# Patient Record
Sex: Male | Born: 1980 | Race: White | Hispanic: No | Marital: Single | Smoking: Current every day smoker
Health system: Southern US, Community
[De-identification: ages and names within clinical notes are randomized; demographics above are authoritative.]

## PROBLEM LIST (undated history)

## (undated) DIAGNOSIS — T7840XA Allergy, unspecified, initial encounter: Secondary | ICD-10-CM

## (undated) DIAGNOSIS — S42302A Unspecified fracture of shaft of humerus, left arm, initial encounter for closed fracture: Secondary | ICD-10-CM

## (undated) HISTORY — DX: Unspecified fracture of shaft of humerus, left arm, initial encounter for closed fracture: S42.302A

## (undated) HISTORY — DX: Allergy, unspecified, initial encounter: T78.40XA

## (undated) HISTORY — PX: FOREARM SURGERY: SHX651

---

## 1999-06-27 ENCOUNTER — Emergency Department (HOSPITAL_COMMUNITY): Admission: EM | Admit: 1999-06-27 | Discharge: 1999-06-27 | Payer: Self-pay | Admitting: Emergency Medicine

## 1999-07-01 ENCOUNTER — Emergency Department (HOSPITAL_COMMUNITY): Admission: EM | Admit: 1999-07-01 | Discharge: 1999-07-01 | Payer: Self-pay | Admitting: Emergency Medicine

## 1999-07-27 ENCOUNTER — Emergency Department (HOSPITAL_COMMUNITY): Admission: EM | Admit: 1999-07-27 | Discharge: 1999-07-27 | Payer: Self-pay | Admitting: Emergency Medicine

## 2011-05-17 ENCOUNTER — Ambulatory Visit: Payer: Self-pay | Admitting: Physician Assistant

## 2011-05-17 VITALS — BP 121/90 | HR 81 | Temp 99.6°F | Resp 18 | Ht 69.0 in | Wt 194.6 lb

## 2011-05-17 DIAGNOSIS — R112 Nausea with vomiting, unspecified: Secondary | ICD-10-CM

## 2011-05-17 DIAGNOSIS — R1013 Epigastric pain: Secondary | ICD-10-CM

## 2011-05-17 DIAGNOSIS — R5383 Other fatigue: Secondary | ICD-10-CM

## 2011-05-17 DIAGNOSIS — R6881 Early satiety: Secondary | ICD-10-CM

## 2011-05-17 LAB — POCT CBC
HCT, POC: 46.8 % (ref 43.5–53.7)
Hemoglobin: 15.6 g/dL (ref 14.1–18.1)
Lymph, poc: 1.1 (ref 0.6–3.4)
MCH, POC: 31.9 pg — AB (ref 27–31.2)
MCHC: 33.3 g/dL (ref 31.8–35.4)
MCV: 95.7 fL (ref 80–97)
POC Granulocyte: 4.4 (ref 2–6.9)
POC LYMPH PERCENT: 17.4 %L (ref 10–50)
RDW, POC: 13.2 %
WBC: 6.4 10*3/uL (ref 4.6–10.2)

## 2011-05-17 MED ORDER — RANITIDINE HCL 150 MG PO TABS: 150.0000 mg | ORAL_TABLET | Freq: Two times a day (BID) | ORAL | Status: AC

## 2011-05-17 NOTE — Patient Instructions (Addendum)
Please reduce your tobacco use and alcohol consumption.  Reduce food and drink that are high in fat, spice and acid.  If your symptoms are not improving, please return for additional evaluation and possible referral.

## 2011-05-17 NOTE — Progress Notes (Signed)
  Subjective:    Patient ID: John Ritter, male    DOB: 1980/06/18, 31 y.o.   MRN: [id number]  HPI Patient presents with abdominal pain, nausea and vomiting x 6 weeks. Early satiety.  Symptoms worse with eating, if doesn't take TUMS/Pepto Bismol will vomit after eating.  Significant loss of appetite.  Trying to eat very small portions. No weight loss noticed.  No hematochezia, stools dark since starting PeptoBismol.  Fatigue.  No subjective fever/chills.  Has had diarrhea. Symptoms wax and wane, but not resolved since onset.  Drinks regularly.  Smokes 1-1.5 ppd.  No NSAIDS.  TUMS QD-BID x 6 years.  Review of Systems As above.    Objective:   Physical Exam  Vital signs noted. Well-developed, well nourished WM who is awake, alert and oriented, in NAD. HEENT: [place]/AT, sclera and conjunctiva are clear.  Neck: supple, non-tender, no lymphadenopathey, thyromegaly. Heart: RRR, no murmur Lungs: CTA Abdomen: normo-active bowel sounds, supple, no mass or organomegaly. Epigastric tenderness. Extremities: no cyanosis, clubbing or edema. Skin: warm and dry without rash.  Results for orders placed in visit on 05/17/11  POCT CBC      Component Value Range   WBC 6.4  4.6 - 10.2 (K/uL)   Lymph, poc 1.1  0.6 - 3.4    POC LYMPH PERCENT 17.4  10 - 50 (%L)   MID (cbc) 0.9  0 - 0.9    POC MID % 13.9 (*) 0 - 12 (%M)   POC Granulocyte 4.4  2 - 6.9    Granulocyte percent 68.7  37 - 80 (%G)   RBC 4.89  4.69 - 6.13 (M/uL)   Hemoglobin 15.6  14.1 - 18.1 (g/dL)   HCT, POC 40.9  81.1 - 53.7 (%)   MCV 95.7  80 - 97 (fL)   MCH, POC 31.9 (*) 27 - 31.2 (pg)   MCHC 33.3  31.8 - 35.4 (g/dL)   RDW, POC 91.4     Platelet Count, POC 284  142 - 424 (K/uL)   MPV 7.8  0 - 99.8 (fL)       Assessment & Plan:   1. Fatigue  POCT CBC  2. Nausea & vomiting    3. Abdominal pain, epigastric    4. Early satiety     Likely GERD.  Add ranitidine 150 mg BID.  If no better in 1 week, add OTC omeprazole.  If still no  better, RTC.  Will proceed with additional evaluation that was deferred today due to cost.  Avoid spice, acid, fat, reduce EtOH and tobacco.

## 2015-05-16 ENCOUNTER — Emergency Department (HOSPITAL_COMMUNITY): Payer: Self-pay | Admitting: Anesthesiology

## 2015-05-16 ENCOUNTER — Inpatient Hospital Stay (HOSPITAL_COMMUNITY)
Admission: EM | Admit: 2015-05-16 | Discharge: 2015-05-20 | DRG: 481 | Disposition: A | Discharge status: Home / Self Care | Payer: Self-pay | Attending: Orthopedic Surgery | Admitting: Orthopedic Surgery

## 2015-05-16 ENCOUNTER — Encounter (HOSPITAL_COMMUNITY): Payer: Self-pay | Admitting: Emergency Medicine

## 2015-05-16 ENCOUNTER — Encounter (HOSPITAL_COMMUNITY)
Admission: EM | Disposition: A | Discharge status: Home / Self Care | Payer: Self-pay | Source: Home / Self Care | Attending: Orthopedic Surgery

## 2015-05-16 ENCOUNTER — Emergency Department (HOSPITAL_COMMUNITY): Payer: Self-pay

## 2015-05-16 DIAGNOSIS — W109XXA Fall (on) (from) unspecified stairs and steps, initial encounter: Secondary | ICD-10-CM | POA: Diagnosis present

## 2015-05-16 DIAGNOSIS — Y92099 Unspecified place in other non-institutional residence as the place of occurrence of the external cause: Secondary | ICD-10-CM

## 2015-05-16 DIAGNOSIS — Z9889 Other specified postprocedural states: Secondary | ICD-10-CM

## 2015-05-16 DIAGNOSIS — S72142A Displaced intertrochanteric fracture of left femur, initial encounter for closed fracture: Principal | ICD-10-CM | POA: Diagnosis present

## 2015-05-16 DIAGNOSIS — S42202A Unspecified fracture of upper end of left humerus, initial encounter for closed fracture: Secondary | ICD-10-CM | POA: Diagnosis present

## 2015-05-16 DIAGNOSIS — F10129 Alcohol abuse with intoxication, unspecified: Secondary | ICD-10-CM | POA: Diagnosis present

## 2015-05-16 DIAGNOSIS — F17210 Nicotine dependence, cigarettes, uncomplicated: Secondary | ICD-10-CM | POA: Diagnosis present

## 2015-05-16 DIAGNOSIS — Z8781 Personal history of (healed) traumatic fracture: Secondary | ICD-10-CM

## 2015-05-16 DIAGNOSIS — S42302A Unspecified fracture of shaft of humerus, left arm, initial encounter for closed fracture: Secondary | ICD-10-CM

## 2015-05-16 DIAGNOSIS — S72009A Fracture of unspecified part of neck of unspecified femur, initial encounter for closed fracture: Secondary | ICD-10-CM | POA: Diagnosis present

## 2015-05-16 DIAGNOSIS — S72002A Fracture of unspecified part of neck of left femur, initial encounter for closed fracture: Secondary | ICD-10-CM

## 2015-05-16 DIAGNOSIS — K5900 Constipation, unspecified: Secondary | ICD-10-CM | POA: Diagnosis not present

## 2015-05-16 HISTORY — PX: INTRAMEDULLARY (IM) NAIL INTERTROCHANTERIC: SHX5875

## 2015-05-16 LAB — CBC WITH DIFFERENTIAL/PLATELET
BASOS ABS: 0 10*3/uL (ref 0.0–0.1)
Basophils Relative: 0 %
EOS ABS: 0 10*3/uL (ref 0.0–0.7)
EOS PCT: 0 %
HCT: 40.7 % (ref 39.0–52.0)
HEMOGLOBIN: 13.9 g/dL (ref 13.0–17.0)
LYMPHS ABS: 1.8 10*3/uL (ref 0.7–4.0)
Lymphocytes Relative: 19 %
MCH: 32.3 pg (ref 26.0–34.0)
MCHC: 34.2 g/dL (ref 30.0–36.0)
MCV: 94.7 fL (ref 78.0–100.0)
Monocytes Absolute: 0.7 10*3/uL (ref 0.1–1.0)
Monocytes Relative: 7 %
NEUTROS PCT: 74 %
Neutro Abs: 6.9 10*3/uL (ref 1.7–7.7)
PLATELETS: 290 10*3/uL (ref 150–400)
RBC: 4.3 MIL/uL (ref 4.22–5.81)
RDW: 13.2 % (ref 11.5–15.5)
WBC: 9.4 10*3/uL (ref 4.0–10.5)

## 2015-05-16 LAB — RAPID URINE DRUG SCREEN, HOSP PERFORMED
Amphetamines: NOT DETECTED
BARBITURATES: NOT DETECTED
Benzodiazepines: NOT DETECTED
COCAINE: NOT DETECTED
Opiates: NOT DETECTED
TETRAHYDROCANNABINOL: NOT DETECTED

## 2015-05-16 LAB — COMPREHENSIVE METABOLIC PANEL
ALBUMIN: 3.5 g/dL (ref 3.5–5.0)
ALK PHOS: 62 U/L (ref 38–126)
ALT: 103 U/L — AB (ref 17–63)
AST: 82 U/L — AB (ref 15–41)
Anion gap: 15 (ref 5–15)
BUN: 7 mg/dL (ref 6–20)
CALCIUM: 8.4 mg/dL — AB (ref 8.9–10.3)
CHLORIDE: 105 mmol/L (ref 101–111)
CO2: 21 mmol/L — AB (ref 22–32)
CREATININE: 0.74 mg/dL (ref 0.61–1.24)
GFR calc non Af Amer: >60 mL/min (ref >60)
GLUCOSE: 92 mg/dL (ref 65–99)
Potassium: 4.3 mmol/L (ref 3.5–5.1)
SODIUM: 141 mmol/L (ref 135–145)
Total Bilirubin: 0.5 mg/dL (ref 0.3–1.2)
Total Protein: 6.8 g/dL (ref 6.5–8.1)

## 2015-05-16 SURGERY — FIXATION, FRACTURE, INTERTROCHANTERIC, WITH INTRAMEDULLARY ROD
Anesthesia: General | Laterality: Left

## 2015-05-16 MED ORDER — ACETAMINOPHEN 650 MG RE SUPP: 650.0000 mg | Freq: Four times a day (QID) | RECTAL | Status: DC | PRN

## 2015-05-16 MED ORDER — LORAZEPAM 1 MG PO TABS: 1.0000 mg | ORAL_TABLET | Freq: Four times a day (QID) | ORAL | Status: DC | PRN

## 2015-05-16 MED ORDER — NEOSTIGMINE METHYLSULFATE 10 MG/10ML IV SOLN
INTRAVENOUS | Status: DC | PRN
  Administered 2015-05-16: 3 mg via INTRAVENOUS

## 2015-05-16 MED ORDER — LACTATED RINGERS IV SOLN: INTRAVENOUS | Status: DC

## 2015-05-16 MED ORDER — FOLIC ACID 1 MG PO TABS: 1.0000 mg | ORAL_TABLET | Freq: Every day | ORAL | Status: DC

## 2015-05-16 MED ORDER — HYDROMORPHONE HCL 1 MG/ML IJ SOLN
INTRAMUSCULAR | Status: AC
  Filled 2015-05-16: qty 1

## 2015-05-16 MED ORDER — ACETAMINOPHEN 10 MG/ML IV SOLN
INTRAVENOUS | Status: AC
  Filled 2015-05-16: qty 100

## 2015-05-16 MED ORDER — ONDANSETRON HCL 4 MG PO TABS: 4.0000 mg | ORAL_TABLET | Freq: Four times a day (QID) | ORAL | Status: DC | PRN

## 2015-05-16 MED ORDER — METOCLOPRAMIDE HCL 5 MG/ML IJ SOLN: 5.0000 mg | Freq: Three times a day (TID) | INTRAMUSCULAR | Status: DC | PRN

## 2015-05-16 MED ORDER — CEFAZOLIN (ANCEF) 1 G IV SOLR
2.0000 g | INTRAVENOUS | Status: DC
Start: 2015-05-16 — End: 2015-05-16

## 2015-05-16 MED ORDER — METHOCARBAMOL 500 MG PO TABS
500.0000 mg | ORAL_TABLET | Freq: Four times a day (QID) | ORAL | Status: DC | PRN
  Administered 2015-05-17 – 2015-05-20 (×11): 500 mg via ORAL
  Filled 2015-05-16 (×13): qty 1

## 2015-05-16 MED ORDER — ONDANSETRON HCL 4 MG/2ML IJ SOLN
INTRAMUSCULAR | Status: DC | PRN
  Administered 2015-05-16: 4 mg via INTRAVENOUS

## 2015-05-16 MED ORDER — PROPOFOL 10 MG/ML IV BOLUS
INTRAVENOUS | Status: DC | PRN
  Administered 2015-05-16: 130 mg via INTRAVENOUS

## 2015-05-16 MED ORDER — MORPHINE SULFATE (PF) 2 MG/ML IV SOLN
0.5000 mg | INTRAVENOUS | Status: DC | PRN
  Administered 2015-05-16: 0.5 mg via INTRAVENOUS
  Filled 2015-05-16: qty 1

## 2015-05-16 MED ORDER — LACTATED RINGERS IV SOLN
INTRAVENOUS | Status: DC | PRN
  Administered 2015-05-16 (×2): via INTRAVENOUS

## 2015-05-16 MED ORDER — LORAZEPAM 2 MG/ML IJ SOLN
0.5000 mg | Freq: Once | INTRAMUSCULAR | Status: AC
  Administered 2015-05-16: 0.5 mg via INTRAVENOUS
  Filled 2015-05-16: qty 1

## 2015-05-16 MED ORDER — MORPHINE SULFATE (PF) 2 MG/ML IV SOLN
2.0000 mg | INTRAVENOUS | Status: DC | PRN
  Administered 2015-05-16 – 2015-05-17 (×3): 2 mg via INTRAVENOUS
  Filled 2015-05-16 (×3): qty 1

## 2015-05-16 MED ORDER — PROPOFOL 10 MG/ML IV BOLUS
INTRAVENOUS | Status: AC
  Filled 2015-05-16: qty 20

## 2015-05-16 MED ORDER — ADULT MULTIVITAMIN W/MINERALS CH: 1.0000 | ORAL_TABLET | Freq: Every day | ORAL | Status: DC

## 2015-05-16 MED ORDER — 0.9 % SODIUM CHLORIDE (POUR BTL) OPTIME
TOPICAL | Status: DC | PRN
  Administered 2015-05-16: 300 mL

## 2015-05-16 MED ORDER — SODIUM CHLORIDE 0.9 % IV BOLUS (SEPSIS)
500.0000 mL | Freq: Once | INTRAVENOUS | Status: AC
  Administered 2015-05-16: 500 mL via INTRAVENOUS

## 2015-05-16 MED ORDER — LORAZEPAM 2 MG/ML IJ SOLN: 1.0000 mg | Freq: Four times a day (QID) | INTRAMUSCULAR | Status: DC | PRN

## 2015-05-16 MED ORDER — MIDAZOLAM HCL 5 MG/5ML IJ SOLN
INTRAMUSCULAR | Status: DC | PRN
  Administered 2015-05-16: 2 mg via INTRAVENOUS

## 2015-05-16 MED ORDER — ACETAMINOPHEN 10 MG/ML IV SOLN
INTRAVENOUS | Status: DC | PRN
Start: 2015-05-16 — End: 2015-05-16
  Administered 2015-05-16: 1000 mg via INTRAVENOUS

## 2015-05-16 MED ORDER — METOCLOPRAMIDE HCL 5 MG PO TABS: 5.0000 mg | ORAL_TABLET | Freq: Three times a day (TID) | ORAL | Status: DC | PRN

## 2015-05-16 MED ORDER — ASPIRIN EC 325 MG PO TBEC
325.0000 mg | DELAYED_RELEASE_TABLET | Freq: Every day | ORAL | Status: DC
  Administered 2015-05-17 – 2015-05-20 (×4): 325 mg via ORAL
  Filled 2015-05-16 (×4): qty 1

## 2015-05-16 MED ORDER — PHENOL 1.4 % MT LIQD: 1.0000 | OROMUCOSAL | Status: DC | PRN

## 2015-05-16 MED ORDER — FENTANYL CITRATE (PF) 250 MCG/5ML IJ SOLN
INTRAMUSCULAR | Status: AC
  Filled 2015-05-16: qty 5

## 2015-05-16 MED ORDER — BUPIVACAINE HCL (PF) 0.25 % IJ SOLN
INTRAMUSCULAR | Status: AC
  Filled 2015-05-16: qty 30

## 2015-05-16 MED ORDER — FENTANYL CITRATE (PF) 250 MCG/5ML IJ SOLN
INTRAMUSCULAR | Status: DC | PRN
  Administered 2015-05-16: 50 ug via INTRAVENOUS
  Administered 2015-05-16 (×2): 100 ug via INTRAVENOUS

## 2015-05-16 MED ORDER — GLYCOPYRROLATE 0.2 MG/ML IJ SOLN
INTRAMUSCULAR | Status: DC | PRN
  Administered 2015-05-16: 0.4 mg via INTRAVENOUS

## 2015-05-16 MED ORDER — BUPIVACAINE HCL (PF) 0.25 % IJ SOLN
INTRAMUSCULAR | Status: DC | PRN
  Administered 2015-05-16: 10 mL

## 2015-05-16 MED ORDER — CEFAZOLIN SODIUM-DEXTROSE 2-3 GM-% IV SOLR
2.0000 g | Freq: Four times a day (QID) | INTRAVENOUS | Status: AC
  Administered 2015-05-16 – 2015-05-17 (×2): 2 g via INTRAVENOUS
  Filled 2015-05-16 (×2): qty 50

## 2015-05-16 MED ORDER — CEFAZOLIN SODIUM-DEXTROSE 2-3 GM-% IV SOLR
INTRAVENOUS | Status: DC | PRN
  Administered 2015-05-16: 2 g via INTRAVENOUS

## 2015-05-16 MED ORDER — MIDAZOLAM HCL 2 MG/2ML IJ SOLN
INTRAMUSCULAR | Status: AC
  Filled 2015-05-16: qty 2

## 2015-05-16 MED ORDER — VITAMIN B-1 100 MG PO TABS: 100.0000 mg | ORAL_TABLET | Freq: Every day | ORAL | Status: DC

## 2015-05-16 MED ORDER — LIDOCAINE HCL (CARDIAC) 20 MG/ML IV SOLN
INTRAVENOUS | Status: DC | PRN
  Administered 2015-05-16: 80 mg via INTRATRACHEAL

## 2015-05-16 MED ORDER — SUCCINYLCHOLINE CHLORIDE 20 MG/ML IJ SOLN
INTRAMUSCULAR | Status: DC | PRN
  Administered 2015-05-16: 120 mg via INTRAVENOUS

## 2015-05-16 MED ORDER — OXYCODONE HCL 5 MG PO TABS
5.0000 mg | ORAL_TABLET | ORAL | Status: DC | PRN
  Administered 2015-05-16 – 2015-05-20 (×21): 10 mg via ORAL
  Filled 2015-05-16 (×22): qty 2

## 2015-05-16 MED ORDER — BUPIVACAINE-EPINEPHRINE (PF) 0.25% -1:200000 IJ SOLN
INTRAMUSCULAR | Status: AC
  Filled 2015-05-16: qty 30

## 2015-05-16 MED ORDER — BUPIVACAINE-EPINEPHRINE 0.25% -1:200000 IJ SOLN
INTRAMUSCULAR | Status: DC | PRN
  Administered 2015-05-16: 5 mL

## 2015-05-16 MED ORDER — LIDOCAINE HCL (CARDIAC) 20 MG/ML IV SOLN
INTRAVENOUS | Status: AC
  Filled 2015-05-16: qty 10

## 2015-05-16 MED ORDER — THIAMINE HCL 100 MG/ML IJ SOLN: 100.0000 mg | Freq: Every day | INTRAMUSCULAR | Status: DC

## 2015-05-16 MED ORDER — HYDROMORPHONE HCL 1 MG/ML IJ SOLN
INTRAMUSCULAR | Status: AC
Start: 2015-05-16 — End: 2015-05-17
  Filled 2015-05-16: qty 1

## 2015-05-16 MED ORDER — MENTHOL 3 MG MT LOZG: 1.0000 | LOZENGE | OROMUCOSAL | Status: DC | PRN

## 2015-05-16 MED ORDER — ONDANSETRON HCL 4 MG/2ML IJ SOLN: 4.0000 mg | Freq: Four times a day (QID) | INTRAMUSCULAR | Status: DC | PRN

## 2015-05-16 MED ORDER — METHOCARBAMOL 1000 MG/10ML IJ SOLN
500.0000 mg | Freq: Four times a day (QID) | INTRAVENOUS | Status: DC | PRN
  Administered 2015-05-16: 500 mg via INTRAVENOUS
  Filled 2015-05-16 (×2): qty 5

## 2015-05-16 MED ORDER — MORPHINE SULFATE (PF) 2 MG/ML IV SOLN: 2.0000 mg | Freq: Once | INTRAVENOUS | Status: DC

## 2015-05-16 MED ORDER — CEFAZOLIN SODIUM-DEXTROSE 2-3 GM-% IV SOLR
INTRAVENOUS | Status: AC
  Filled 2015-05-16: qty 50

## 2015-05-16 MED ORDER — ROCURONIUM BROMIDE 100 MG/10ML IV SOLN
INTRAVENOUS | Status: DC | PRN
  Administered 2015-05-16: 20 mg via INTRAVENOUS
  Administered 2015-05-16: 10 mg via INTRAVENOUS

## 2015-05-16 MED ORDER — ACETAMINOPHEN 325 MG PO TABS
650.0000 mg | ORAL_TABLET | Freq: Four times a day (QID) | ORAL | Status: DC | PRN
  Administered 2015-05-17: 650 mg via ORAL
  Filled 2015-05-16: qty 2

## 2015-05-16 MED ORDER — DIPHENHYDRAMINE HCL 50 MG/ML IJ SOLN
INTRAMUSCULAR | Status: DC | PRN
  Administered 2015-05-16: 25 mg via INTRAVENOUS

## 2015-05-16 MED ORDER — HYDROMORPHONE HCL 1 MG/ML IJ SOLN
0.2500 mg | INTRAMUSCULAR | Status: DC | PRN
  Administered 2015-05-16 (×4): 0.5 mg via INTRAVENOUS

## 2015-05-16 SURGICAL SUPPLY — 46 items
BIT DRILL FLUTED FEMUR 4.2/3 (BIT) ×2 IMPLANT
BLADE HELICAL TFNA STRL (Anchor) ×2 IMPLANT
BNDG COHESIVE 4X5 TAN STRL (GAUZE/BANDAGES/DRESSINGS) ×3 IMPLANT
BNDG GAUZE ELAST 4 BULKY (GAUZE/BANDAGES/DRESSINGS) ×3 IMPLANT
COVER PERINEAL POST (MISCELLANEOUS) ×3 IMPLANT
COVER SURGICAL LIGHT HANDLE (MISCELLANEOUS) ×3 IMPLANT
DRAPE STERI IOBAN 125X83 (DRAPES) ×3 IMPLANT
DRAPE U-SHAPE 47X51 STRL (DRAPES) ×6 IMPLANT
DRSG AQUACEL AG ADV 3.5X10 (GAUZE/BANDAGES/DRESSINGS) ×4 IMPLANT
DRSG MEPILEX BORDER 4X8 (GAUZE/BANDAGES/DRESSINGS) ×3 IMPLANT
DRSG PAD ABDOMINAL 8X10 ST (GAUZE/BANDAGES/DRESSINGS) ×3 IMPLANT
ELECT PENCIL ROCKER SW 15FT (MISCELLANEOUS) ×3 IMPLANT
ELECT REM PT RETURN 9FT ADLT (ELECTROSURGICAL) ×3
ELECTRODE REM PT RTRN 9FT ADLT (ELECTROSURGICAL) ×1 IMPLANT
GLOVE BIO SURGEON STRL SZ 6.5 (GLOVE) ×2 IMPLANT
GLOVE BIO SURGEONS STRL SZ 6.5 (GLOVE) ×1
GLOVE BIOGEL PI IND STRL 6.5 (GLOVE) ×1 IMPLANT
GLOVE BIOGEL PI IND STRL 8.5 (GLOVE) ×1 IMPLANT
GLOVE BIOGEL PI INDICATOR 6.5 (GLOVE) ×2
GLOVE BIOGEL PI INDICATOR 8.5 (GLOVE) ×2
GLOVE SS BIOGEL STRL SZ 8.5 (GLOVE) ×1 IMPLANT
GLOVE SUPERSENSE BIOGEL SZ 8.5 (GLOVE) ×2
GOWN STRL REUS W/ TWL LRG LVL3 (GOWN DISPOSABLE) ×1 IMPLANT
GOWN STRL REUS W/TWL 2XL LVL3 (GOWN DISPOSABLE) ×6 IMPLANT
GOWN STRL REUS W/TWL LRG LVL3 (GOWN DISPOSABLE) ×3
GUIDEWIRE 3.2X400 (WIRE) ×4 IMPLANT
KIT BASIN OR (CUSTOM PROCEDURE TRAY) ×3 IMPLANT
KIT ROOM TURNOVER OR (KITS) ×3 IMPLANT
LINER BOOT UNIVERSAL DISP (MISCELLANEOUS) ×3 IMPLANT
MANIFOLD NEPTUNE II (INSTRUMENTS) ×3 IMPLANT
NAIL IM CANN TFNA 12X235 130D (Nail) IMPLANT
NAIL TI CANN TFNA 12MM/130DEG (Nail) ×2 IMPLANT
NEEDLE 22X1 1/2 (OR ONLY) (NEEDLE) ×2 IMPLANT
NS IRRIG 1000ML POUR BTL (IV SOLUTION) ×3 IMPLANT
PACK GENERAL/GYN (CUSTOM PROCEDURE TRAY) ×3 IMPLANT
PAD ARMBOARD 7.5X6 YLW CONV (MISCELLANEOUS) ×6 IMPLANT
SCREW LOCK STAR 5X38 (Screw) ×3 IMPLANT
STAPLER VISISTAT 35W (STAPLE) IMPLANT
SURGIFLO W/THROMBIN 8M KIT (HEMOSTASIS) IMPLANT
SUT BONE WAX W31G (SUTURE) ×3 IMPLANT
SUT VIC AB 1 CT1 18XCR BRD 8 (SUTURE) ×1 IMPLANT
SUT VIC AB 1 CT1 8-18 (SUTURE) ×3
SUT VIC AB 2-0 CT1 18 (SUTURE) ×3 IMPLANT
SUT VIC AB 2-0 CTB1 (SUTURE) IMPLANT
SYR CONTROL 10ML LL (SYRINGE) ×2 IMPLANT
WATER STERILE IRR 1000ML POUR (IV SOLUTION) ×6 IMPLANT

## 2015-05-16 NOTE — Brief Op Note (Signed)
05/16/2015  7:33 PM  PATIENT:  John Ritter  35 y.o. male  PRE-OPERATIVE DIAGNOSIS:  hip fracture left  POST-OPERATIVE DIAGNOSIS:  hip fracture left  PROCEDURE:  Procedure(s): INTRAMEDULLARY (IM) NAIL INTERTROCHANTRIC (Left)  SURGEON:  Surgeon(s) and Role:    * Venita Lickahari Dailin Sosnowski, MD - Primary  PHYSICIAN ASSISTANT:   ASSISTANTS: none   ANESTHESIA:   general  EBL:  Total I/O In: 500 [I.V.:500] Out: 250 [Blood:250]  BLOOD ADMINISTERED:none  DRAINS: none   LOCAL MEDICATIONS USED:  MARCAINE     SPECIMEN:  No Specimen  DISPOSITION OF SPECIMEN:  N/A  COUNTS:  YES  TOURNIQUET:  * No tourniquets in log *  DICTATION: .Other Dictation: Dictation Number [id number]  PLAN OF CARE: Admit to inpatient   PATIENT DISPOSITION:  PACU - hemodynamically stable.

## 2015-05-16 NOTE — Progress Notes (Signed)
Orthopedic Tech Progress Note Patient Details:  John SchlichterZachariah D Ritter [birth year] [id number]  Ortho Devices Ortho Device/Splint Location: applied ohf to bed Ortho Device/Splint Interventions: Ordered, Application   Jennye MoccasinHughes, Tammy Ericsson Craig 05/16/2015, 10:30 PM

## 2015-05-16 NOTE — Op Note (Signed)
John Ritter, John Ritter            ACCOUNT NO.:  [id number]  MEDICAL RECORD NO.:  [id number]  LOCATION:  [facility]                         FACILITY:  [facility]  PHYSICIAN:  Tiawanna Luchsinger D. Shon Baton, M.D. DATE OF BIRTH:  [birth year]  DATE OF PROCEDURE:  05/16/2015 DATE OF DISCHARGE:                              OPERATIVE REPORT   PREOPERATIVE DIAGNOSES:  Left intertrochanteric hip fracture.  Left proximal humerus fracture.  POSTOPERATIVE DIAGNOSES:  Left intertrochanteric hip fracture.  Left proximal humerus fracture.  OPERATIVE PROCEDURE:  Intramedullary nail fixation of left thigh intertrochanteric hip fracture.  COMPLICATIONS:  None.  INSTRUMENTATION SYSTEM USED:  Synthes European nail cephalomedullary 12 mm in diameter with a 105 mm lag screw and 38 mm distal locking screw.  COMPLICATIONS:  None.  CONDITION:  Stable.  HISTORY:  This is a very pleasant 35 year old gentleman who was intoxicated yesterday and fell down a flight of steps and then presented today with inability to ambulate.  Imaging studies demonstrated a left intertrochanteric hip fracture along with the left minimally displaced proximal humerus fracture.  After discussing treatment options, he elected to proceed with surgery for the hip.  All appropriate risks, benefits, and alternatives were discussed with the patient, and consent was obtained.  OPERATIVE NOTE:  The patient was brought to the operating room and placed supine on the operating table.  After successful induction of general anesthesia and endotracheal intubation, TEDs and SCDs were inserted.  The patient was placed supine on the fracture table.  The right lower extremity was placed in a well leg holder.  The left was placed in a gentle traction.  Gentle closed reduction maneuver was performed and the leg was locked into position.  The hip was then prepped and draped in a standard fashion.  Time-out was taken to confirm patient, procedure, and all other  pertinent important data.  X-rays in the AP and lateral planes confirmed satisfactory overall positioning of the fracture and the hip joint itself.  An incision was made starting at the tip of the greater trochanter and proceeding superiorly.  Sharp dissection was carried out down to the deep fascia.  This was sharply incised.  A guide pin was then advanced percutaneously to the tip of the greater trochanter.  Once it was properly positioned, I then advanced this into the intertrochanteric region and passed the fraction into the proximal femur.  I made sure that I was appropriately at correct trajectory in the AP and lateral planes.  Once I was satisfied with the positioning of the 1st guidepin, I then obtained my own one reamer and reamed out the proximal aspect of the femur.  I then obtained the appropriate size nail and malleted it to the appropriate depth.  I had satisfactory positioning in the AP and lateral planes.  The 2nd incision was made for the locking lag screw and a guide pin was advanced through the apparatus to the lateral aspect of the femur.  I then advanced the guide pin.  I confirmed satisfactory position of the guidepin in the AP and lateral planes.  I then measured and then drilled to the appropriate depth.  I then advanced the lag screw and made sure I had proper  positioning in both planes.  Once this was completed, I then compressed the fracture site and then locked down the top locking and backed off a quarter turn to allow some lag compression.  I then removed the inserting device for the lag screw.  I placed my static locking device and drilled across the femur.  I measured and then placed a size 38 screw.  At this point, I had good purchase with all my screws.  The inserting apparatus was removed.  Both wounds were copiously irrigated with normal saline.  They were closed in a layered fashion with #1 Vicryl sutures, 2-0 Vicryl sutures, and staples for the skin.   Steri- Strips, dry dressing were applied.  Final x-rays were taken which were satisfactory.  Hardware and graft were in good position.  Fracture was well reduced.  At this point in time, the patient was then extubated and transferred to the PACU without incident.  At the end of the case, all needle and sponge counts were correct.     Brinklee Cisse D. Shon BatonBrooks, M.D.     DDB/MEDQ  D:  05/16/2015  T:  05/16/2015  Job:  [id number]

## 2015-05-16 NOTE — ED Notes (Addendum)
Per EMS: pt fell down stairs last night at 0300; pt admits to ETOH; pt with shortening and rotation to left leg; pt sts pain to left shoulder; pt denies LOC; pt given fentanyl in route

## 2015-05-16 NOTE — Anesthesia Postprocedure Evaluation (Signed)
Anesthesia Post Note  Patient: John SchlichterZachariah D Ritter  Procedure(s) Performed: Procedure(s) (LRB): INTRAMEDULLARY (IM) NAIL INTERTROCHANTRIC (Left)  Patient location during evaluation: PACU Anesthesia Type: General Level of consciousness: awake and alert Pain management: pain level controlled Vital Signs Assessment: post-procedure vital signs reviewed and stable Respiratory status: spontaneous breathing, nonlabored ventilation and respiratory function stable Cardiovascular status: blood pressure returned to baseline and stable Postop Assessment: no signs of nausea or vomiting Anesthetic complications: no    Last Vitals:  Filed Vitals:   05/16/15 2016 05/16/15 2030  BP: 145/90 145/85  Pulse: 80 71  Temp:    Resp: 20 12    Last Pain:  Filed Vitals:   05/16/15 2038  PainSc: 7                  Victoriah Wilds,W. EDMOND

## 2015-05-16 NOTE — ED Notes (Signed)
Ortho at bedside.

## 2015-05-16 NOTE — H&P (Signed)
No primary care provider on file. Chief Complaint: S/p fall last evening History: This is a 35 year old man who presents today complaining of left leg pain after a fall last night. He states that he was out drinking when he fell down several steps at his friend's house. They said it hurt at the time and he was unable to get up. His friend gave him some blankets and he slept on the floor. He woke up this morning and he continued to have pain in this area. He was unable to get up so they eventually called EMS to transport him. He has some rotation of the left hip and shortening. He states he has some pain in his left shoulder. He denies striking his head. He states that he fell down the steps of his legs going down first and did not injure his head or back. Any other substances besides alcohol. Past Medical History  Diagnosis Date  . Allergy     cat and dog  . Arm fracture, left     No Known Allergies  No current facility-administered medications on file prior to encounter.   Current Outpatient Prescriptions on File Prior to Encounter  Medication Sig Dispense Refill  . calcium carbonate (TUMS - DOSED IN MG ELEMENTAL CALCIUM) 500 MG chewable tablet Chew 2 tablets by mouth 2 (two) times daily.      Physical Exam: Filed Vitals:   05/16/15 1300 05/16/15 1510  BP: 125/72 147/81  Pulse: 89 79  Temp:    Resp: 16 13  A+OX3 No sob/cp. Lungs CTA Compartments soft/NT abd soft/NT Significant left groin/pelvic pain.  No gross deformity of the lower extremity EHL/TA/GA intact Left UE: no laceration/abrasion.  Ax/Median/ulnar/radial/PIN function intact Sling in place for left shoulder   Image: Dg Chest Portable 1 View  05/16/2015  CLINICAL DATA:  Left hip fracture.  Preop evaluation. EXAM: PORTABLE CHEST 1 VIEW COMPARISON:  None. FINDINGS: The heart size and mediastinal contours are within normal limits. Both lungs are clear. The visualized skeletal structures are unremarkable. IMPRESSION:  Normal examination. Electronically Signed   By: Beckie Salts M.D.   On: 05/16/2015 15:24   Dg Shoulder Left  05/16/2015  CLINICAL DATA:  Fall down a flight of stairs last night, pain in left hip and left shoulder EXAM: LEFT SHOULDER - 2+ VIEW COMPARISON:  None. FINDINGS: There is a comminuted minimally displaced fracture involving the greater tuberosity of the left humerus. No dislocation or subluxation. Visualized portion of the left lung apex is unremarkable. IMPRESSION: Comminuted fracture of the left proximal humerus. Electronically Signed   By: Norva Pavlov M.D.   On: 05/16/2015 14:37   Dg Hip Unilat With Pelvis 2-3 Views Left  05/16/2015  CLINICAL DATA:  Left hip pain after falling down stairs last night. EXAM: DG HIP (WITH OR WITHOUT PELVIS) 2-3V LEFT COMPARISON:  None. FINDINGS: Minimally comminuted left intertrochanteric fracture. No significant displacement or angulation. IMPRESSION: Minimally comminuted left intertrochanteric fracture. Electronically Signed   By: Beckie Salts M.D.   On: 05/16/2015 14:36    A/P:  35 yr old s/p fall yesterday evening who presents with left arm and leg pain, inability to walk. Imaging demonstrated minimally displaced inter-troch fracture of the left hip, and minimally displaced left prox humerus fracture. No focal neuro deficits on exam Compartments soft and NT - no evidence of DVT Plan: left hip fracture - plan on cephlo-medullary nail for definitive fracture management Sling for left proximal humerus fracture Will review films with my  partner concerning definitive management of left shoulder fracture. Reviewed risks: AVN, post-traumatic arthritis, need for further surgery, death, stroke, paralysis, malunion, non-union

## 2015-05-16 NOTE — Anesthesia Procedure Notes (Signed)
Procedure Name: Intubation Date/Time: 05/16/2015 6:29 PM Performed by: Brien MatesMAHONY, Praneel Haisley D Pre-anesthesia Checklist: Patient identified, Emergency Drugs available, Suction available, Patient being monitored and Timeout performed Patient Re-evaluated:Patient Re-evaluated prior to inductionOxygen Delivery Method: Circle system utilized Preoxygenation: Pre-oxygenation with 100% oxygen Intubation Type: IV induction Ventilation: Mask ventilation without difficulty and Oral airway inserted - appropriate to patient size Laryngoscope Size: Miller and 2 Grade View: Grade I Tube type: Oral Tube size: 7.5 mm Number of attempts: 1 Airway Equipment and Method: Stylet Placement Confirmation: ETT inserted through vocal cords under direct vision,  positive ETCO2 and breath sounds checked- equal and bilateral Secured at: 23 cm Tube secured with: Tape Dental Injury: Teeth and Oropharynx as per pre-operative assessment

## 2015-05-16 NOTE — Transfer of Care (Signed)
Immediate Anesthesia Transfer of Care Note  Patient: John SchlichterZachariah D Ritter  Procedure(s) Performed: Procedure(s): INTRAMEDULLARY (IM) NAIL INTERTROCHANTRIC (Left)  Patient Location: PACU  Anesthesia Type:General  Level of Consciousness: awake  Airway & Oxygen Therapy: Patient Spontanous Breathing and Patient connected to face mask oxygen  Post-op Assessment: Report given to RN and Post -op Vital signs reviewed and stable  Post vital signs: Reviewed and stable  Last Vitals:  Filed Vitals:   05/16/15 1300 05/16/15 1510  BP: 125/72 147/81  Pulse: 89 79  Temp:    Resp: 16 13    Complications: No apparent anesthesia complications

## 2015-05-16 NOTE — Anesthesia Preprocedure Evaluation (Addendum)
Anesthesia Evaluation  Patient identified by MRN, date of birth, ID band Patient awake    Reviewed: Allergy & Precautions, H&P , NPO status , Patient's Chart, lab work & pertinent test results  Airway Mallampati: II  TM Distance: >3 FB     Dental no notable dental hx. (+) Teeth Intact, Dental Advisory Given   Pulmonary Current Smoker,    Pulmonary exam normal breath sounds clear to auscultation       Cardiovascular negative cardio ROS   Rhythm:Regular Rate:Normal     Neuro/Psych negative neurological ROS  negative psych ROS   GI/Hepatic negative GI ROS, Neg liver ROS,   Endo/Other  negative endocrine ROS  Renal/GU negative Renal ROS  negative genitourinary   Musculoskeletal   Abdominal   Peds  Hematology negative hematology ROS (+)   Anesthesia Other Findings   Reproductive/Obstetrics negative OB ROS                            Anesthesia Physical Anesthesia Plan  ASA: II  Anesthesia Plan: General   Post-op Pain Management:    Induction: Intravenous, Rapid sequence and Cricoid pressure planned  Airway Management Planned: Oral ETT  Additional Equipment:   Intra-op Plan:   Post-operative Plan: Extubation in OR  Informed Consent: I have reviewed the patients History and Physical, chart, labs and discussed the procedure including the risks, benefits and alternatives for the proposed anesthesia with the patient or authorized representative who has indicated his/her understanding and acceptance.   Dental advisory given  Plan Discussed with: CRNA  Anesthesia Plan Comments:         Anesthesia Quick Evaluation

## 2015-05-16 NOTE — ED Provider Notes (Signed)
CSN: [id number]     Arrival date & time 05/16/15  1154 History   First MD Initiated Contact with Patient 05/16/15 1158     Chief Complaint  Patient presents with  . Fall     (Consider location/radiation/quality/duration/timing/severity/associated sxs/prior Treatment) HPI This is a 35 year old man who presents today complaining of left leg pain after a fall last night. He states that he was out drinking when he fell down several steps at his friend's house. They said it hurt at the time and he was unable to get up. His friend gave him some blankets and he slept on the floor. He woke up this morning and he continued to have pain in this area. He was unable to get up so they eventually called EMS to transport him. He has some rotation of the left hip and shortening. He states he has some pain in his left shoulder. He denies striking his head. He states that he fell down the steps of his legs going down first and did not injure his head or back. Any other substances besides alcohol. Past Medical History  Diagnosis Date  . Allergy     cat and dog  . Arm fracture, left    History reviewed. No pertinent past surgical history. History reviewed. No pertinent family history. Social History  Substance Use Topics  . Smoking status: Current Every Day Smoker -- 1.50 packs/day for 16 years    Types: Cigarettes  . Smokeless tobacco: Never Used  . Alcohol Use: Yes    Review of Systems  All other systems reviewed and are negative.     Allergies  Review of patient's allergies indicates no known allergies.  Home Medications   Prior to Admission medications   Medication Sig Start Date End Date Taking? Authorizing Provider  calcium carbonate (TUMS - DOSED IN MG ELEMENTAL CALCIUM) 500 MG chewable tablet Chew 2 tablets by mouth 2 (two) times daily.   Yes Historical Provider, MD  ibuprofen (ADVIL,MOTRIN) 200 MG tablet Take 600 mg by mouth every 6 (six) hours as needed.   Yes Historical Provider, MD    BP 125/72 mmHg  Pulse 89  Temp(Src) 98.5 F (36.9 C) (Oral)  Resp 16  SpO2 94% Physical Exam  Constitutional: He is oriented to person, place, and time. He appears well-developed and well-nourished. No distress.  HENT:  Head: Normocephalic and atraumatic.  Right Ear: External ear normal.  Left Ear: External ear normal.  Nose: Nose normal.  Mouth/Throat: Oropharynx is clear and moist.  Eyes: Conjunctivae are normal.  Neck: Normal range of motion. Neck supple.  Cardiovascular: Normal rate and regular rhythm.   Pulmonary/Chest: Effort normal and breath sounds normal.  Abdominal: Soft. Bowel sounds are normal. He exhibits no distension. There is no tenderness.  Musculoskeletal:  Left lower extremity is slightly externally rotated with tenderness to palpation over left hip. Knee appears stable. Ankle appears stable. Dorsal pedalis pulses intact. Sensation is intact in the foot. He has full active range of motion of the left ankle and toes. No obvious external signs of trauma and left upper extremity. He has some tenderness to palpation over the left lateral shoulder. No tenderness palpation over the clavicle or the scapula. Left elbow left forearm and left hand appear normal with no tenderness palpation. Radial pulses are intact. Sensation is intact. Entire spine visualized and palpated without any tenderness to palpation.  Neurological: He is alert and oriented to person, place, and time. He displays normal reflexes. No cranial nerve  deficit. He exhibits normal muscle tone. Coordination normal.  Skin: Skin is warm and dry.  Psychiatric: He has a normal mood and affect. His behavior is normal. Judgment and thought content normal.  Nursing note and vitals reviewed.   ED Course  Procedures (including critical care time) Labs Review Labs Reviewed  CBC WITH DIFFERENTIAL/PLATELET  COMPREHENSIVE METABOLIC PANEL  URINE RAPID DRUG SCREEN, HOSP PERFORMED    Imaging Review Dg Shoulder  Left  05/16/2015  CLINICAL DATA:  Fall down a flight of stairs last night, pain in left hip and left shoulder EXAM: LEFT SHOULDER - 2+ VIEW COMPARISON:  None. FINDINGS: There is a comminuted minimally displaced fracture involving the greater tuberosity of the left humerus. No dislocation or subluxation. Visualized portion of the left lung apex is unremarkable. IMPRESSION: Comminuted fracture of the left proximal humerus. Electronically Signed   By: Norva PavlovElizabeth  Brown M.D.   On: 05/16/2015 14:37   Dg Hip Unilat With Pelvis 2-3 Views Left  05/16/2015  CLINICAL DATA:  Left hip pain after falling down stairs last night. EXAM: DG HIP (WITH OR WITHOUT PELVIS) 2-3V LEFT COMPARISON:  None. FINDINGS: Minimally comminuted left intertrochanteric fracture. No significant displacement or angulation. IMPRESSION: Minimally comminuted left intertrochanteric fracture. Electronically Signed   By: Beckie SaltsSteven  Reid M.D.   On: 05/16/2015 14:36   I have personally reviewed and evaluated these images and lab results as part of my medical decision-making.   EKG Interpretation   Date/Time:  Sunday May 16 2015 14:53:19 EDT Ventricular Rate:  81 PR Interval:  151 QRS Duration: 99 QT Interval:  398 QTC Calculation: 462 R Axis:   78 Text Interpretation:  Sinus rhythm Confirmed by Darina Hartwell MD, Duwayne HeckANIELLE ([id number])  on 05/16/2015 3:11:22 PM      MDM   Final diagnoses:  Hip fracture, left, closed, initial encounter (HCC)  Humerus fracture, left, closed, initial encounter    35 show man who was intoxicated last night and fell down multiple stairs. He is complaining of left hip pain left shoulder pain. X-rays reveal left hip fracture with comminuted left intratrochanteric fracture. Patient appears to be vascularly intact distal to the injury. Left shoulder shows a comminuted fracture of the left proximal humerus. He appears to be neurovascularly intact distal to this injury. Remainder of his evaluation appears normal. Does not appear  to have any head, neck, chest, abdominal, or back injury. Plan consultation to trauma surgery and orthopedic surgery. Discussed with patient alcohol use and he admits to up to 12 beers per day. We will start prophylaxis for alcohol withdrawal here in the emergency department. Currently pending are chest x-Grayden Burley, EKG, and preop labs. He is made nothing by mouth.  Discussed with trauma and, advised as isolated orthopedic injuries, to have ortho care for patient.  Ortho consult pending  Discussed with Dr. Shon BatonBrooks. He advises that the left shoulder replaced in a sling. He will be in to see and admit the patient for hip repair.  Margarita Grizzleanielle Shahir Karen, MD 05/16/15 [phone]

## 2015-05-17 ENCOUNTER — Encounter (HOSPITAL_COMMUNITY): Payer: Self-pay | Admitting: Orthopedic Surgery

## 2015-05-17 LAB — BASIC METABOLIC PANEL
Anion gap: 9 (ref 5–15)
BUN: 6 mg/dL (ref 6–20)
CHLORIDE: 103 mmol/L (ref 101–111)
CO2: 22 mmol/L (ref 22–32)
CREATININE: 0.73 mg/dL (ref 0.61–1.24)
Calcium: 8.1 mg/dL — ABNORMAL LOW (ref 8.9–10.3)
GFR calc Af Amer: >60 mL/min (ref >60)
GFR calc non Af Amer: >60 mL/min (ref >60)
Glucose, Bld: 145 mg/dL — ABNORMAL HIGH (ref 65–99)
Potassium: 4.1 mmol/L (ref 3.5–5.1)
Sodium: 134 mmol/L — ABNORMAL LOW (ref 135–145)

## 2015-05-17 LAB — CBC
HCT: 37.8 % — ABNORMAL LOW (ref 39.0–52.0)
Hemoglobin: 12.5 g/dL — ABNORMAL LOW (ref 13.0–17.0)
MCH: 31.5 pg (ref 26.0–34.0)
MCHC: 33.1 g/dL (ref 30.0–36.0)
MCV: 95.2 fL (ref 78.0–100.0)
Platelets: 256 10*3/uL (ref 150–400)
RBC: 3.97 MIL/uL — ABNORMAL LOW (ref 4.22–5.81)
RDW: 13.1 % (ref 11.5–15.5)
WBC: 9.6 10*3/uL (ref 4.0–10.5)

## 2015-05-17 NOTE — ED Notes (Signed)
1.5 mg ativan wasted with Catering managerJazmine RN

## 2015-05-17 NOTE — Progress Notes (Signed)
Utilization review completed. Antwan Bribiesca, RN, BSN. 

## 2015-05-17 NOTE — Progress Notes (Signed)
    Subjective: 1 Day Post-Op Procedure(s) (LRB): INTRAMEDULLARY (IM) NAIL INTERTROCHANTRIC (Left) Patient reports pain as 3 on 0-10 scale.   Denies CP or SOB.  Voiding without difficulty. Positive flatus. Objective: Vital signs in last 24 hours: Temp:  [97.6 F (36.4 C)-98.2 F (36.8 C)] 98.2 F (36.8 C) (03/20 1100) Pulse Rate:  [68-91] 88 (03/20 1100) Resp:  [11-27] 13 (03/20 1100) BP: (125-149)/(74-90) 125/74 mmHg (03/20 1100) SpO2:  [93 %-100 %] 95 % (03/20 1100) Weight:  [88.2 kg (194 lb 7.1 oz)] 88.2 kg (194 lb 7.1 oz) (03/20 0100)  Intake/Output from previous day: 03/19 0701 - 03/20 0700 In: 2642.5 [P.O.:920; I.V.:1722.5] Out: 1100 [Urine:850; Blood:250] Intake/Output this shift: Total I/O In: 360 [P.O.:360] Out: -   Labs:  Recent Labs  05/16/15 1504 05/17/15 0234  HGB 13.9 12.5*    Recent Labs  05/16/15 1504 05/17/15 0234  WBC 9.4 9.6  RBC 4.30 3.97*  HCT 40.7 37.8*  PLT 290 256    Recent Labs  05/16/15 1504 05/17/15 0234  NA 141 134*  K 4.3 4.1  CL 105 103  CO2 21* 22  BUN 7 6  CREATININE 0.74 0.73  GLUCOSE 92 145*  CALCIUM 8.4* 8.1*   No results for input(s): LABPT, INR in the last 72 hours.  Physical Exam: Neurologically intact ABD soft Intact pulses distally Incision: dressing C/D/I and no drainage Compartment soft  Assessment/Plan: 1 Day Post-Op Procedure(s) (LRB): INTRAMEDULLARY (IM) NAIL INTERTROCHANTRIC (Left) Advance diet Up with therapy  Will discuss shoulder fracture management with my partner Dr Rennis ChrisSupple Patient will most likely need to be bed to chair transfer/wheelchair dependent for 6-8 weeks Will start d/c planning  SW aware.  Venita LickBROOKS,Jarell Mcewen D for Dr. Venita Lickahari Dequavious Harshberger [facility] [phone] 05/17/2015, 1:35 PM

## 2015-05-17 NOTE — Evaluation (Signed)
Occupational Therapy Evaluation Patient Details Name: John SchlichterZachariah D Ritter MRN: [id number] DOB: [birth year] Today's Date: 05/17/2015    History of Present Illness Admitted after fall down stairs resulting in L proximal humerus fx (NWB, sling), and L hip fx, now s/p ORIF L hip, PWB   Clinical Impression   Pt admitted with fall with above dx. Pt currently with functional limitations due to the deficits listed below (see OT Problem List).  Pt will benefit from skilled OT to increase their safety and independence with ADL and functional mobility for ADL to facilitate discharge to venue listed below.      Follow Up Recommendations  Supervision/Assistance - 24 hour    Equipment Recommendations  3 in 1 bedside comode;Tub/shower bench;Wheelchair cushion (measurements OT)       Precautions / Restrictions Precautions Precaution Comments: premedicate for pain Required Braces or Orthoses: Sling (sling LUE- NWB) Restrictions LUE Weight Bearing: Non weight bearing LLE Weight Bearing: Partial weight bearing      Mobility Bed Mobility               General bed mobility comments: did not perform  Transfers                 General transfer comment: did not perform         ADL Overall ADL's : Needs assistance/impaired                 Upper Body Dressing : Maximal assistance;Bed level Upper Body Dressing Details (indicate cue type and reason): educated pt in technique and correct positioning of sling                   General ADL Comments: pt supine in bed for OT eval.  Educated pt on options for dressing, DME needs ( 3 n 1 and tub bench as well as WC) Sister present and stated they would look for DME at good will and get back to therapy regarding needs. Much of OT eval focused on DC planning - DME needs, performing ADL activity [facility], etc               Pertinent Vitals/Pain Pain Score: 7  Pain Location: L hip Pain Descriptors / Indicators:  Sore Pain Intervention(s): Patient requesting pain meds-RN notified;Ice applied     Hand Dominance     Extremity/Trunk Assessment Upper Extremity Assessment Upper Extremity Assessment: LUE deficits/detail LUE Deficits / Details: pt able to move L elbow (keeping LUE at side), wrist and fingers. minimal edema noted           Communication Communication Communication: No difficulties   Cognition Arousal/Alertness: Awake/alert Behavior During Therapy: WFL for tasks assessed/performed Overall Cognitive Status: Within Functional Limits for tasks assessed                           Shoulder Instructions Shoulder Instructions Donning/doffing sling/immobilizer: Moderate assistance ROM for elbow, wrist and digits of operated UE: Minimal assistance Positioning of UE while sleeping: Minimal assistance    Home Living Family/patient expects to be discharged to:: Private residence Living Arrangements: Other relatives Available Help at Discharge: Family;Available PRN/intermittently Type of Home: House Home Access: Stairs to enter [employer] CorporationEntrance Stairs-Number of Steps: 4 Entrance Stairs-Rails:  (Family is looking into temporary ramp) Home Layout: One level     Bathroom Shower/Tub: Tub/shower unit         Home Equipment: None  Prior Functioning/Environment Level of Independence: Independent             OT Diagnosis: Generalized weakness;Acute pain   OT Problem List: Decreased strength;Decreased range of motion;Decreased activity tolerance;Pain   OT Treatment/Interventions: Self-care/ADL training;DME and/or AE instruction;Patient/family education;Therapeutic exercise    OT Goals(Current goals can be found in the care plan section) Acute Rehab OT Goals Patient Stated Goal: less pain OT Goal Formulation: With patient Time For Goal Achievement: 05/31/15 Potential to Achieve Goals: Good ADL Goals Pt Will Perform Upper Body Dressing: with min assist;sitting Pt  Will Perform Lower Body Dressing: with min assist;sit to/from stand;with adaptive equipment;sitting/lateral leans;with caregiver independent in assisting Pt Will Transfer to Toilet: with min assist;regular height toilet;bedside commode Pt Will Perform Toileting - Clothing Manipulation and hygiene: with min assist;sit to/from stand;sitting/lateral leans Pt Will Perform Tub/Shower Transfer: with min assist;tub bench;Squat pivot transfer;with caregiver independent in assisting Pt/caregiver will Perform Home Exercise Program: Left upper extremity;With written HEP provided;Independently  OT Frequency: Min 3X/week              End of Session  Activity Tolerance: Patient limited by pain Patient left: in bed;with call bell/phone within reach;with family/visitor present   Time: 1610-9604 OT Time Calculation (min): 22 min Charges:  OT General Charges $OT Visit: 1 Procedure OT Evaluation $OT Eval Moderate Complexity: 1 Procedure G-Codes:    Einar Crow D May 24, 2015, 4:30 PM

## 2015-05-17 NOTE — Evaluation (Signed)
Physical Therapy Evaluation Patient Details Name: John SchlichterZachariah D Ritter MRN: [id number] DOB: Nov 09, 1980 Today's Date: 05/17/2015   History of Present Illness  Admitted after fall down stairs resulting in L proximal humerus fx (NWB, sling), and L hip fx, now s/p ORIF L hip, PWB  Clinical Impression  Patient is s/p above diagnosis and surgery resulting in functional limitations due to the deficits listed below (see PT Problem List). With NWB LUE, and PWB LLE, he will be mobile at a largely wheelchair transfer level;  Patient will benefit from skilled PT to increase their independence and safety with mobility to allow discharge to the venue listed below.       Follow Up Recommendations Home health PT;Supervision/Assistance - 24 hour    Equipment Recommendations  3in1 (PT);Wheelchair (measurements PT);Wheelchair cushion (measurements PT);Other (comment) (consider tub transfer bench (will defer to OT))    Recommendations for Other Services OT consult     Precautions / Restrictions Precautions Precaution Comments: premedicate for pain Restrictions Weight Bearing Restrictions: Yes LUE Weight Bearing: Non weight bearing LLE Weight Bearing: Partial weight bearing LLE Partial Weight Bearing Percentage or Pounds: 10%      Mobility  Bed Mobility Overal bed mobility: Needs Assistance Bed Mobility: Supine to Sit     Supine to sit: Min assist;Max assist     General bed mobility comments: able to use RLE to half-bridge hips to EOB and pre-position to sit up on EOB; after first unsuccessful attempt to sit up, Took time to find best angle of hips for more success; still, pain very much limited bed mobility, and pt required max handheld assist to pull to sit  Transfers Overall transfer level: Needs assistance   Transfers: Stand Pivot Transfers   Stand pivot transfers: Min guard       General transfer comment: Stand pivot to the right side bed to chair; cues for technqiue, and minguard for  safety, but pt did not need physical assist  Ambulation/Gait                Stairs            Wheelchair Mobility    Modified Rankin (Stroke Patients Only)       Balance                                             Pertinent Vitals/Pain Pain Assessment: 0-10 Pain Score: 7  Pain Location: L shoulder and L hip in recliner at end of session; more pain with movement Pain Descriptors / Indicators: Grimacing;Guarding;Aching Pain Intervention(s): Monitored during session;Premedicated before session;Repositioned    Home Living Family/patient expects to be discharged to:: Private residence Living Arrangements: Other relatives Available Help at Discharge: Family;Available PRN/intermittently Type of Home: House Home Access: Stairs to enter Entrance Stairs-Rails:  (Family is looking into temporary ramp) Entrance Stairs-Number of Steps: 4 Home Layout: One level Home Equipment: None      Prior Function Level of Independence: Independent               Hand Dominance        Extremity/Trunk Assessment   Upper Extremity Assessment: Defer to OT evaluation (replaced sling for optimal fit)           Lower Extremity Assessment: LLE deficits/detail   LLE Deficits / Details: Able to activate muscles and move hip with assist in all planes of  motion; ROM grossly limtied by pain  Cervical / Trunk Assessment: Normal  Communication   Communication: No difficulties  Cognition Arousal/Alertness: Awake/alert Behavior During Therapy: WFL for tasks assessed/performed Overall Cognitive Status: Within Functional Limits for tasks assessed                      General Comments General comments (skin integrity, edema, etc.): Session conducted on Room Air; good use of incentive spirometer    Exercises        Assessment/Plan    PT Assessment Patient needs continued PT services  PT Diagnosis Acute pain;Difficulty walking   PT Problem List  Decreased strength;Decreased range of motion;Decreased activity tolerance;Decreased balance;Decreased mobility;Decreased knowledge of use of DME;Decreased knowledge of precautions;Pain  PT Treatment Interventions DME instruction;Functional mobility training;Therapeutic activities;Therapeutic exercise;Balance training;Patient/family education;Wheelchair mobility training   PT Goals (Current goals can be found in the Care Plan section) Acute Rehab PT Goals Patient Stated Goal: less pain PT Goal Formulation: With patient Time For Goal Achievement: 05/31/15 Potential to Achieve Goals: Good Additional Goals Additional Goal #1: Pt will independently propel and manage wheelchair household distances    Frequency Min 6X/week   Barriers to discharge Inaccessible home environment Family is looking into temporary ramp for the 4 steps to enter home    Co-evaluation               End of Session   Activity Tolerance: Patient limited by pain Patient left: in chair;with call bell/phone within reach;with family/visitor present Nurse Communication: Mobility status         Time: 2841-3244 PT Time Calculation (min) (ACUTE ONLY): 28 min   Charges:   PT Evaluation $PT Eval Moderate Complexity: 1 Procedure PT Treatments $Therapeutic Activity: 8-22 mins   PT G Codes:        Olen Pel 05/17/2015, 10:43 AM  Van Clines, PT  Acute Rehabilitation Services Pager [phone] Office [phone]

## 2015-05-18 ENCOUNTER — Encounter (HOSPITAL_COMMUNITY): Payer: Self-pay | Admitting: Orthopedic Surgery

## 2015-05-18 LAB — BASIC METABOLIC PANEL
Anion gap: 9 (ref 5–15)
BUN: 6 mg/dL (ref 6–20)
CHLORIDE: 100 mmol/L — AB (ref 101–111)
CO2: 25 mmol/L (ref 22–32)
CREATININE: 0.8 mg/dL (ref 0.61–1.24)
Calcium: 8.2 mg/dL — ABNORMAL LOW (ref 8.9–10.3)
GFR calc Af Amer: >60 mL/min (ref >60)
GFR calc non Af Amer: >60 mL/min (ref >60)
GLUCOSE: 106 mg/dL — AB (ref 65–99)
Potassium: 3.1 mmol/L — ABNORMAL LOW (ref 3.5–5.1)
SODIUM: 134 mmol/L — AB (ref 135–145)

## 2015-05-18 LAB — CBC
HCT: 35.6 % — ABNORMAL LOW (ref 39.0–52.0)
Hemoglobin: 12 g/dL — ABNORMAL LOW (ref 13.0–17.0)
MCH: 32 pg (ref 26.0–34.0)
MCHC: 33.7 g/dL (ref 30.0–36.0)
MCV: 94.9 fL (ref 78.0–100.0)
PLATELETS: 187 10*3/uL (ref 150–400)
RBC: 3.75 MIL/uL — ABNORMAL LOW (ref 4.22–5.81)
RDW: 13 % (ref 11.5–15.5)
WBC: 7 10*3/uL (ref 4.0–10.5)

## 2015-05-18 MED ORDER — ENOXAPARIN SODIUM 40 MG/0.4ML ~~LOC~~ SOLN
40.0000 mg | SUBCUTANEOUS | Status: DC
  Administered 2015-05-18 – 2015-05-20 (×3): 40 mg via SUBCUTANEOUS
  Filled 2015-05-18 (×3): qty 0.4

## 2015-05-18 MED ORDER — METHOCARBAMOL 500 MG PO TABS: 500.0000 mg | ORAL_TABLET | Freq: Three times a day (TID) | ORAL | Status: DC | PRN

## 2015-05-18 MED ORDER — ONDANSETRON HCL 4 MG PO TABS: 4.0000 mg | ORAL_TABLET | Freq: Three times a day (TID) | ORAL | Status: DC | PRN

## 2015-05-18 MED ORDER — OXYCODONE-ACETAMINOPHEN 10-325 MG PO TABS: 1.0000 | ORAL_TABLET | ORAL | Status: DC | PRN

## 2015-05-18 MED ORDER — ENOXAPARIN SODIUM 30 MG/0.3ML ~~LOC~~ SOLN: 40.0000 mg | Freq: Two times a day (BID) | SUBCUTANEOUS | Status: DC

## 2015-05-18 MED ORDER — ASPIRIN 325 MG PO TBEC: 325.0000 mg | DELAYED_RELEASE_TABLET | Freq: Once | ORAL | Status: DC

## 2015-05-18 NOTE — Discharge Instructions (Signed)
Hip Fracture A hip fracture is a fracture of the upper part of your thigh bone (femur).  CAUSES A hip fracture is caused by a direct blow to the side of your hip. This is usually the result of a fall but can occur in other circumstances, such as an automobile accident. RISK FACTORS There is an increased risk of hip fractures in people with:  An unsteady walking pattern (gait) and those with conditions that contribute to poor balance, such as Parkinson's disease or dementia.  Osteopenia and osteoporosis.  Cancer that spreads to the leg bones.  Certain metabolic diseases. SYMPTOMS  Symptoms of hip fracture include:  Pain over the injured hip.  Inability to put weight on the leg in which the fracture occurred (although, some patients are able to walk after a hip fracture).  Toes and foot of the affected leg point outward when you lie down. DIAGNOSIS A physical exam can determine if a hip fracture is likely to have occurred. X-ray exams are needed to confirm the fracture and to look for other injuries. The X-ray exam can help to determine the type of hip fracture. Rarely, the fracture is not visible on an X-ray image and a CT scan or MRI will have to be done. TREATMENT  The treatment for a fracture is usually surgery. This means using a screw, nail, or rod to hold the bones in place.  HOME CARE INSTRUCTIONS Take all medicines as directed by your health care provider. SEEK MEDICAL CARE IF: Pain continues, even after taking pain medicine. MAKE SURE YOU:  Understand these instructions.   Will watch your condition.  Will get help right away if you are not doing well or get worse.   This information is not intended to replace advice given to you by your health care provider. Make sure you discuss any questions you have with your health care provider.   Document Released: 02/13/2005 Document Revised: 02/18/2013 Document Reviewed: 09/25/2012 Elsevier Interactive Patient Education 2016  [employer]  Hip Fracture A hip fracture is a fracture of the upper part of your thigh bone (femur).  CAUSES A hip fracture is caused by a direct blow to the side of your hip. This is usually the result of a fall but can occur in other circumstances, such as an automobile accident. RISK FACTORS There is an increased risk of hip fractures in people with:  An unsteady walking pattern (gait) and those with conditions that contribute to poor balance, such as Parkinson's disease or dementia.  Osteopenia and osteoporosis.  Cancer that spreads to the leg bones.  Certain metabolic diseases. SYMPTOMS  Symptoms of hip fracture include:  Pain over the injured hip.  Inability to put weight on the leg in which the fracture occurred (although, some patients are able to walk after a hip fracture).  Toes and foot of the affected leg point outward when you lie down. DIAGNOSIS A physical exam can determine if a hip fracture is likely to have occurred. X-ray exams are needed to confirm the fracture and to look for other injuries. The X-ray exam can help to determine the type of hip fracture. Rarely, the fracture is not visible on an X-ray image and a CT scan or MRI will have to be done. TREATMENT  The treatment for a fracture is usually surgery. This means using a screw, nail, or rod to hold the bones in place.  HOME CARE INSTRUCTIONS Take all medicines as directed by your health care provider. SEEK MEDICAL  CARE IF: Pain continues, even after taking pain medicine. MAKE SURE YOU:  Understand these instructions.   Will watch your condition.  Will get help right away if you are not doing well or get worse.   This information is not intended to replace advice given to you by your health care provider. Make sure you discuss any questions you have with your health care provider.   Document Released: 02/13/2005 Document Revised: 02/18/2013 Document Reviewed: 09/25/2012 Elsevier Interactive  Patient Education [employer]

## 2015-05-18 NOTE — Progress Notes (Signed)
Rehab Admissions Coordinator Note:  Patient was screened by Clois DupesBoyette, Wallis Vancott Godwin for appropriateness for an Inpatient Acute Rehab Consult per OT change in recommendation. If pt would like to consider an inpt rehab admission as an uninsured admit, please order an inpt rehab consult to assess for possible admission.  Clois DupesBoyette, Cormac Wint Godwin 05/18/2015, 12:16 PM  I can be reached at [phone].

## 2015-05-18 NOTE — Progress Notes (Signed)
Occupational Therapy Treatment Patient Details Name: Collene SchlichterZachariah D Sherley MRN: [id number] DOB: [birth year] Today's Date: 05/18/2015    History of present illness Admitted after fall down stairs resulting in L proximal humerus fx (NWB, sling), and L hip fx, now s/p ORIF L hip, PWB   OT comments  This 35 yo male admitted with above presents to acute OT today not making progress due to pain and having no functional use of LUE/LLE. He currently is Max-Mod for bed mobility (depending on [facility]down and use or non use of rail), Mod A for squat pivot to recliner going to his right, and total A for LBB/D and Max A for UBB/D. He would benefit from CIR to get more mobile and independent with basic ADLs so family would only need to be there intermittently (since that is all they can do now per pt). We will continue to follow acutely.  Follow Up Recommendations  CIR;Other (comment) (if not CIR, then SNF would be next best option; last option HHOT with 24 hour S/prn A)    Equipment Recommendations  3 in 1 bedside comode;Hospital bed;Wheelchair cushion (measurements OT);Wheelchair (measurements OT);Other (comment) (elevating leg rests)       Precautions / Restrictions Precautions Precautions: Fall Precaution Comments: premedicate for pain Required Braces or Orthoses: Sling Restrictions Weight Bearing Restrictions: Yes LUE Weight Bearing: Non weight bearing LLE Weight Bearing: Partial weight bearing LLE Partial Weight Bearing Percentage or Pounds: Spoke with PT Jeanice Lim([place]) who spoke to Dr. Shon BatonBrooks this morning and she reported that Dr. Shon BatonBrooks said pt could put "just under 50% of weight on his LLE"       Mobility Bed Mobility Overal bed mobility: Needs Assistance Bed Mobility: Supine to Sit     Supine to sit: HOB elevated;Mod assist (and use of rail)     General bed mobility comments: It took pt over 20 minutes + to get up to the EOB with me with him trying to do as much as he could by himself. We  intially tried with HOB flat and no rail, but there was no way he could do this with +1 A so switched to HOB up and use of rail with pt then Mod A to get up to EOB  Transfers Overall transfer level: Needs assistance   Transfers: Squat Pivot Transfers     Squat pivot transfers: Mod assist (bed>recliner pt going to his right)     General transfer comment: I did discuss with pt about needing to be able to transfer in both directions since the feasiblity of only being able to transfer to the right is slim    Balance Overall balance assessment: Needs assistance Sitting-balance support: Single extremity supported;Feet supported Sitting balance-Leahy Scale: Poor Sitting balance - Comments: reliant on use of bed rail and only pretty much sitting no right hip       Standing balance comment: Pt does not really get to full standing--squat pivot                   ADL Overall ADL's : Needs assistance/impaired                                       General ADL Comments: We discussed that he would need to go as simple as possible with clothing (ie: no shirt in house and boxers/athletic shorts OR nothing and cover with towel/sheet--if he would be  comfortable with this). If he does chose to dress UB I advised him to wear larger t-shirt over sling (not try to put on under sling due to pt is extremely painful at shoulder at present and LLE sx pain prevents him from leaning to let gravity A with bringing his arm away from body without AROM). I did go ahead and issue him AE that he cannot use all of now, but will in a few weeks and they will help him be more indpendent                Cognition   Behavior During Therapy: [facility] for tasks assessed/performed Overall Cognitive Status: Within Functional Limits for tasks assessed       Memory: Decreased short-term memory (pt asked me how long he should sit up in recliner, I anwered him with after you have your next pain meds. 10  minutes later he asked me the same question. When I responded with same answer he said oh you did say that didn't you.)                            Pertinent Vitals/ Pain       Pain Assessment: 0-10 Pain Score: 10-Worst pain ever Pain Location: LLE and LUE with movement (prior to movement pt reported an 8/10) Pain Descriptors / Indicators: Aching;Sore Pain Intervention(s): Repositioned;Monitored during session         Frequency Min 3X/week     Progress Toward Goals  OT Goals(current goals can now be found in the care plan section)  Progress towards OT goals: Not progressing toward goals - comment (due to pain and non-functional use of LLE and LUE)     Plan Discharge plan needs to be updated          Activity Tolerance Patient limited by pain   Patient Left in chair;with call bell/phone within reach   Nurse Communication  (PT to get pt back to bed, so did not address mobility with RN)        Time: 1610-9604 OT Time Calculation (min): 58 min  Charges: OT General Charges $OT Visit: 1 Procedure OT Treatments $Self Care/Home Management : 53-67 mins  Evette Georges [phone] 05/18/2015, 11:29 AM

## 2015-05-18 NOTE — Progress Notes (Addendum)
    Subjective: 2 Days Post-Op Procedure(s) (LRB): INTRAMEDULLARY (IM) NAIL INTERTROCHANTRIC (Left) Patient reports pain as 3 on 0-10 scale.   Denies CP or SOB.  Voiding without difficulty. Positive flatus. Objective: Vital signs in last 24 hours: Temp:  [98.2 F (36.8 C)-99.1 F (37.3 C)] 99.1 F (37.3 C) (03/21 0425) Pulse Rate:  [85-88] 85 (03/21 0425) Resp:  [13-15] 15 (03/21 0425) BP: (116-127)/(63-76) 127/76 mmHg (03/21 0425) SpO2:  [95 %-98 %] 98 % (03/21 0425)  Intake/Output from previous day: 03/20 0701 - 03/21 0700 In: 960 [P.O.:960] Out: 1300 [Urine:1300] Intake/Output this shift:    Labs:  Recent Labs  05/16/15 1504 05/17/15 0234 05/18/15 0436  HGB 13.9 12.5* 12.0*    Recent Labs  05/17/15 0234 05/18/15 0436  WBC 9.6 7.0  RBC 3.97* 3.75*  HCT 37.8* 35.6*  PLT 256 187    Recent Labs  05/17/15 0234 05/18/15 0436  NA 134* 134*  K 4.1 3.1*  CL 103 100*  CO2 22 25  BUN 6 6  CREATININE 0.73 0.80  GLUCOSE 145* 106*  CALCIUM 8.1* 8.2*   No results for input(s): LABPT, INR in the last 72 hours.  Physical Exam: Neurologically intact ABD soft Intact pulses distally Incision: dressing C/D/I Compartment soft  Assessment/Plan: 2 Days Post-Op Procedure(s) (LRB): INTRAMEDULLARY (IM) NAIL INTERTROCHANTRIC (Left) Advance diet Up with therapy: bed to chair transfers Patient without insurance and has limited financial resources.  Given this will be difficult to get him placed into a SNF.  He also indicated that he could not afford a lot of medications. Plan:  d/c to home with wheelchair, 3n1 comode, shower chair.  lovenox for dvt prevention - will try and get care manager to set up match program  Incentive spirometer for improving pulmonary toilet  F/u in 10-14 days for wound check  Sling for shoulder.  Reviewed films with Dr Rennis ChrisSupple - agree with non-operative fracture management      Marijayne Rauth D for Dr. Venita Lickahari [facility]line [facility] [phone] 05/18/2015, 7:39 AM

## 2015-05-18 NOTE — Progress Notes (Signed)
Physical Therapy Treatment Patient Details Name: John Ritter MRN: [id number] DOB: [birth year] Today's Date: 05/18/2015    History of Present Illness Admitted after fall down stairs resulting in L proximal humerus fx (NWB, sling), and L hip fx, now s/p ORIF L hip, PWB    PT Comments    Session focused on overall movement and transfer training, problem-solving through tolerable positions/angles for successful transfer; Noting very good improvements in activity tolerance (after being premedicated for pain), especially with more time in unsupported sitting and multiple weight shifts and hip scooting at edge of chair and edge of bed; Able to use RUE and RLE push to shift hips and position better for moving;   Able to participate in RLE therex, using leg lifter for self-AAROM; this gives John Ritter a further repertoire of movement that will help with bed mobility;   Discussed with OT some notable signs of  decr memory; he was unable to recall three specific pieces of info OT gave him about 2 hours prior; It is worth watching him closely for neuro signs, in case he actually did hit his head in the fall.  John Ritter will benefit from further rehab to maximize independence and safety with mobility prior to dc home; Have updated DC rec to CIR and I believe OT has placed a CIR screen.   Follow Up Recommendations  CIR     Equipment Recommendations  Wheelchair (measurements PT);Wheelchair cushion (measurements PT);Hospital bed (drop-arm BSC, tub transfer bench)    Recommendations for Other Services       Precautions / Restrictions Precautions Precautions: Fall Precaution Comments: premedicate for pain Required Braces or Orthoses: Sling Restrictions Weight Bearing Restrictions: Yes LUE Weight Bearing: Non weight bearing LLE Weight Bearing: Partial weight bearing LLE Partial Weight Bearing Percentage or Pounds: Per Dr. Shon Baton am 3/21: pt could put "just under 50% of weight on his LLE"     Mobility  Bed Mobility Overal bed mobility: Needs Assistance Bed Mobility: Sit to Supine     Supine to sit: HOB elevated;Mod assist (and use of rail) Sit to supine: Mod assist   General bed mobility comments: Verbal and demo cues for technqiue, weight shifts, and options to use RUE to help shift hips in bed and pre-position to lay down; Initial attempt was to lay down with bed in flat position, however we reached a point where he needed the [facility] to be elevated to him; Mod assist to help LLE into bed; cues for positioning once in the bed; used RUE on trapeze and RLE to push up in teh bed  Transfers Overall transfer level: Needs assistance Equipment used: None Transfers: Squat Pivot Transfers     Squat pivot transfers: Min guard (recliner to bed transferring towards R)     General transfer comment: Verbal and demo cues for technique; Able to use RLE and RUE to shift hips to the front right corner of recliner and "aimed" his R heel at the bed (the place to where he is transferring); Very close guard for safety, but John Ritter was able to push up on RLE and support self on RUE as well during transfer to bed  Ambulation/Gait                 Stairs            Wheelchair Mobility    Modified Rankin (Stroke Patients Only)       Balance Overall balance assessment: Needs assistance Sitting-balance support: Single extremity supported;Feet supported Sitting balance-Leahy Scale:  Fair Sitting balance - Comments: reliant on use of bed rail and only pretty much sitting no right hip       Standing balance comment: Pt does not really get to full standing--squat pivot                    Cognition Arousal/Alertness: Awake/alert Behavior During Therapy: WFL for tasks assessed/performed Overall Cognitive Status: Within Functional Limits for tasks assessed       Memory:  (Unable to recall three specific pieces of information that OT gave him in earlier session today)               Exercises General Exercises - Lower Extremity Ankle Circles/Pumps: AROM;Both;10 reps Quad Sets: AROM;Left;15 reps Heel Slides: AAROM;Left;10 reps (self-active assist with leg lifter) Straight Leg Raises: AAROM;Left;5 reps (self-AAROM with leg lifter)    General Comments General comments (skin integrity, edema, etc.): O2 sats 98% on Room Air      Pertinent Vitals/Pain Pain Assessment: 0-10 Pain Score: 8  Pain Location: LUE and LLE; 8/10 with hip ROM therex Pain Descriptors / Indicators: Aching;Grimacing;Guarding Pain Intervention(s): Limited activity within patient's tolerance;Monitored during session;Premedicated before session;Repositioned    Home Living                      Prior Function            PT Goals (current goals can now be found in the care plan section) Acute Rehab PT Goals Patient Stated Goal: less pain; States he would like to dc to home if at all possible PT Goal Formulation: With patient Time For Goal Achievement: 05/31/15 Potential to Achieve Goals: Good Progress towards PT goals: Progressing toward goals    Frequency  Min 6X/week    PT Plan Current plan remains appropriate    Co-evaluation             End of Session Equipment Utilized During Treatment: Gait belt Activity Tolerance: Patient tolerated treatment well Patient left: in bed;with call bell/phone within reach;with family/visitor present     Time: [id number] PT Time Calculation (min) (ACUTE ONLY): 43 min  Charges:  $Therapeutic Activity: 38-52 mins                    G Codes:      Olen PelGarrigan, Kydan Shanholtzer Hamff 05/18/2015, 12:16 PM  Van ClinesHolly Simya Tercero, [place]PT  Acute Rehabilitation Services Pager [phone] Office [phone]

## 2015-05-18 NOTE — Progress Notes (Signed)
ANTICOAGULATION CONSULT NOTE - Initial Consult  Pharmacy Consult for Lovenox Indication: VTE prophylaxis  No Known Allergies  Patient Measurements: Height: 5\' 8"  (172.7 cm) Weight: 194 lb 7.1 oz (88.2 kg) IBW/kg (Calculated) : 68.4 Lovenox Dosing Weight: 88 kg  Vital Signs: Temp: 99.1 F (37.3 C) (03/21 0425) Temp Source: Oral (03/20 2127) BP: 127/76 mmHg (03/21 0425) Pulse Rate: 85 (03/21 0425)  Labs:  Recent Labs  05/16/15 1504 05/17/15 0234 05/18/15 0436  HGB 13.9 12.5* 12.0*  HCT 40.7 37.8* 35.6*  PLT 290 256 187  CREATININE 0.74 0.73 0.80    Estimated Creatinine Clearance: 137.8 mL/min (by C-G formula based on Cr of 0.8).   Medical History: Past Medical History  Diagnosis Date  . Allergy     cat and dog  . Arm fracture, left    Assessment:   POD# 2 IM nail left hip after fracture.   To begin Lovenox for VTE prophylaxis.  Has been on ASA 325 mg daily + SCDs.   88 kg, good renal function, Hgb 12.0, platelet count 187.     Goal of Therapy:  appropriate Lovenox dose for VTE prophylaxis Monitor platelets by anticoagulation protocol: Yes   Plan:   Lovenox 40 mg sq q24hrs.  Intermittent CBC.  Dennie FettersEgan, Alawna Graybeal Donovan, [place] Pager: [phone] 05/18/2015,9:01 AM

## 2015-05-19 ENCOUNTER — Encounter (HOSPITAL_COMMUNITY): Payer: Self-pay | Admitting: Orthopedic Surgery

## 2015-05-19 DIAGNOSIS — S72002S Fracture of unspecified part of neck of left femur, sequela: Secondary | ICD-10-CM

## 2015-05-19 DIAGNOSIS — S42302A Unspecified fracture of shaft of humerus, left arm, initial encounter for closed fracture: Secondary | ICD-10-CM

## 2015-05-19 LAB — CBC
HCT: 35.1 % — ABNORMAL LOW (ref 39.0–52.0)
HEMOGLOBIN: 12.1 g/dL — AB (ref 13.0–17.0)
MCH: 32.8 pg (ref 26.0–34.0)
MCHC: 34.5 g/dL (ref 30.0–36.0)
MCV: 95.1 fL (ref 78.0–100.0)
PLATELETS: 224 10*3/uL (ref 150–400)
RBC: 3.69 MIL/uL — ABNORMAL LOW (ref 4.22–5.81)
RDW: 12.8 % (ref 11.5–15.5)
WBC: 7.8 10*3/uL (ref 4.0–10.5)

## 2015-05-19 MED ORDER — MAGNESIUM CITRATE PO SOLN
0.5000 | Freq: Once | ORAL | Status: AC
  Administered 2015-05-19: 0.5 via ORAL
  Filled 2015-05-19: qty 296

## 2015-05-19 MED ORDER — FLEET ENEMA 7-19 GM/118ML RE ENEM
1.0000 | ENEMA | Freq: Once | RECTAL | Status: DC
  Filled 2015-05-19: qty 1

## 2015-05-19 MED ORDER — ENOXAPARIN SODIUM 40 MG/0.4ML ~~LOC~~ SOLN: 40.0000 mg | SUBCUTANEOUS | Status: DC

## 2015-05-19 MED ORDER — DOCUSATE SODIUM 100 MG PO CAPS: 100.0000 mg | ORAL_CAPSULE | Freq: Two times a day (BID) | ORAL | Status: DC

## 2015-05-19 MED ORDER — POLYETHYLENE GLYCOL 3350 17 G PO PACK: 17.0000 g | PACK | Freq: Every day | ORAL | Status: DC | PRN

## 2015-05-19 NOTE — Progress Notes (Signed)
Dr. Wynn BankerKirsteins recommends HH at this time. Not in need of an intense inpt rehab admission for does not have ambulation goals. RN CM is arranging d/c home. [phone]

## 2015-05-19 NOTE — Consult Note (Signed)
Physical Medicine and Rehabilitation Consult Reason for Consult: Left intertrochanteric hip fracture, left proximal humerus fracture after fall Referring Physician: Dr. Shon Baton   HPI: John Ritter is a 35 y.o. right handed male with history of tobacco abuse. Independent prior to admission. One level home.. Presented 05/16/2015 after a fall down several steps at a friend's home after a night out drinking. No loss of consciousness. X-rays and imaging revealed left intertrochanteric hip fracture as well as left proximal humerus fracture. Underwent intramedullary nail fixation of left hip 05/16/2015 per Dr. Shon Baton. Left humerus fracture non-operative placed in a shoulder sling nonweightbearing. Patient partial weightbearing left lower extremity. Hospital course pain management. Subcutaneous Lovenox for DVT prophylaxis.  Pain is under good control today with oral medications. He had a contact guard/MinA transfer to chair with OT this morning, No ambulation goals because of weightbearing status  Review of Systems  Constitutional: Negative for fever and chills.  HENT: Negative for hearing loss.   Eyes: Negative for blurred vision.  Respiratory: Negative for cough and shortness of breath.   Cardiovascular: Negative for chest pain, palpitations and leg swelling.  Gastrointestinal: Positive for constipation. Negative for nausea and vomiting.  Genitourinary: Negative for dysuria, urgency and hematuria.  Skin: Negative for rash.  Neurological: Negative for seizures, loss of consciousness and headaches.  All other systems reviewed and are negative.  Past Medical History  Diagnosis Date  . Allergy     cat and dog  . Arm fracture, left    Past Surgical History  Procedure Laterality Date  . Intramedullary (im) nail intertrochanteric Left 05/16/2015    Procedure: INTRAMEDULLARY (IM) NAIL INTERTROCHANTRIC;  Surgeon: Venita Lick, MD;  Location: [facility] OR;  Service: Orthopedics;  Laterality:  Left;   History reviewed. No pertinent family history. Social History:  reports that he has been smoking Cigarettes.  He has a 24 pack-year smoking history. He has never used smokeless tobacco. He reports that he drinks alcohol. He reports that he does not use illicit drugs. Allergies: No Known Allergies Medications Prior to Admission  Medication Sig Dispense Refill  . calcium carbonate (TUMS - DOSED IN MG ELEMENTAL CALCIUM) 500 MG chewable tablet Chew 2 tablets by mouth 2 (two) times daily.    Marland Kitchen ibuprofen (ADVIL,MOTRIN) 200 MG tablet Take 600 mg by mouth every 6 (six) hours as needed.      Home: Home Living Family/patient expects to be discharged to:: Private residence Living Arrangements: Other relatives Available Help at Discharge: Family, Available PRN/intermittently Type of Home: House Home Access: Stairs to enter Secretary/administrator of Steps: 4 Entrance Stairs-Rails:  (Family is looking into temporary ramp) Home Layout: One level Bathroom Shower/Tub: Tub/shower unit Home Equipment: None  Functional History: Prior Function Level of Independence: Independent Functional Status:  Mobility: Bed Mobility Overal bed mobility: Needs Assistance Bed Mobility: Sit to Supine Supine to sit: HOB elevated, Mod assist (and use of rail) Sit to supine: Mod assist General bed mobility comments: Verbal and demo cues for technqiue, weight shifts, and options to use RUE to help shift hips in bed and pre-position to lay down; Initial attempt was to lay down with bed in flat position, however we reached a point where he needed the [facility] to be elevated to him; Mod assist to help LLE into bed; cues for positioning once in the bed; used RUE on trapeze and RLE to push up in teh bed Transfers Overall transfer level: Needs assistance Equipment used: None Transfers: Scientist, clinical (histocompatibility and immunogenetics) Transfers  Stand pivot transfers: Min guard Squat pivot transfers: Min guard (recliner to bed transferring towards R) General  transfer comment: Verbal and demo cues for technique; Able to use RLE and RUE to shift hips to the front right corner of recliner and "aimed" his R heel at the bed (the place to where he is transferring); Very close guard for safety, but Ian MalkinZach was able to push up on RLE and support self on RUE as well during transfer to bed      ADL: ADL Overall ADL's : Needs assistance/impaired Upper Body Dressing : Maximal assistance, Bed level Upper Body Dressing Details (indicate cue type and reason): educated pt in technique and correct positioning of sling General ADL Comments: We discussed that he would need to go as simple as possible with clothing (ie: no shirt in house and boxers/athletic shorts OR nothing and cover with towel/sheet--if he would be comfortable with this). If he does chose to dress UB I advised him to wear larger t-shirt over sling (not try to put on under sling due to pt is extremely painful at shoulder at present and LLE sx pain prevents him from leaning to let gravity A with bringing his arm away from body without AROM). I did go ahead and issue him AE that he cannot use all of now, but will in a few weeks and they will help him be more indpendent  Cognition: Cognition Overall Cognitive Status: Within Functional Limits for tasks assessed Orientation Level: Oriented X4   Blood pressure 125/72, pulse 84, temperature 98.7 F (37.1 C), temperature source Oral, resp. rate 16, height 5\' 8"  (1.727 m), weight 88.2 kg (194 lb 7.1 oz), SpO2 96 %. Physical Exam  Vitals reviewed. Constitutional: He is oriented to person, place, and time. He appears well-developed.  HENT:  Head: Normocephalic.  Eyes: EOM are normal.  Neck: Normal range of motion. Neck supple. No thyromegaly present.  Cardiovascular: Normal rate and regular rhythm.   Respiratory: Effort normal and breath sounds normal. No respiratory distress.  GI: Soft. Bowel sounds are normal. He exhibits no distension.  Neurological: He  is alert and oriented to person, place, and time.  Skin:  Left hip incision is dressed appropriately tender. Left upper extremity with shoulder sling.    No results found for this or any previous visit (from the past 24 hour(s)). No results found.  Assessment/Plan: Diagnosis: Left intertrochanteric fracture status post IM nail with partial weightbearing, left humeral fracture nonweightbearing related to fall while intoxicated 1. Does the need for close, 24 hr/day medical supervision in concert with the patient's rehab needs make it unreasonable for this patient to be served in a less intensive setting? No 2. Co-Morbidities requiring supervision/potential complications: Pain management 3. Due to skin/wound care and pain management, does the patient require 24 hr/day rehab nursing? No 4. Does the patient require coordinated care of a physician, rehab nurse, PT, OT to address physical and functional deficits in the context of the above medical diagnosis(es)? No Addressing deficits in the following areas: balance, endurance, strength, bathing, dressing and toileting 5. Can the patient actively participate in an intensive therapy program of at least 3 hrs of therapy per day at least 5 days per week? Potentially 6. The potential for patient to make measurable gains while on inpatient rehab is not applicable 7. Anticipated functional outcomes upon discharge from inpatient rehab are n/a  with PT, n/a with OT, n/a with SLP. 8. Estimated rehab length of stay to reach the above functional  goals is: Not applicable, really needs just 1 or 2 more days in the hospital to achieve rehabilitation goals 9. Does the patient have adequate social supports and living environment to accommodate these discharge functional goals? Yes 10. Anticipated D/C setting: Home 11. Anticipated post D/C treatments: HH therapy 12. Overall Rehab/Functional Prognosis: good  RECOMMENDATIONS: This patient's condition is appropriate  for continued rehabilitative care in the following setting: [facility] Therapy Patient has agreed to participate in recommended program. Yes Note that insurance prior authorization may be required for reimbursement for recommended care.  Comment: No ambulation goals related to nonweightbearing status primarily on the left upper extremity, cannot use walker, Discussed my recommendations with patient, his mother as well as OT    05/19/2015

## 2015-05-19 NOTE — Progress Notes (Signed)
Orthopedic Tech Progress Note Patient Details:  John SchlichterZachariah D Ritter Sep 15, 1980 [id number]  Ortho Devices Type of Ortho Device: Sling immobilizer Ortho Device/Splint Location: lue Ortho Device/Splint Interventions: Application As ordered by Dr. Ellis SavageBrooks  [name] 05/19/2015, 11:45 AM

## 2015-05-19 NOTE — Progress Notes (Signed)
    Subjective: 3 Days Post-Op Procedure(s) (LRB): INTRAMEDULLARY (IM) NAIL INTERTROCHANTRIC (Left) Patient reports pain as 3 on 0-10 scale.   Denies CP or SOB.  Voiding without difficulty. Positive flatus. Objective: Vital signs in last 24 hours: Temp:  [98.4 F (36.9 C)-98.7 F (37.1 C)] 98.6 F (37 C) (03/22 0649) Pulse Rate:  [76-86] 76 (03/22 0649) Resp:  [15-16] 15 (03/22 0649) BP: (121-125)/(69-76) 123/69 mmHg (03/22 0649) SpO2:  [92 %-98 %] 92 % (03/22 0649)  Intake/Output from previous day: 03/21 0701 - 03/22 0700 In: 1540 [P.O.:1540] Out: 1225 [Urine:1225] Intake/Output this shift: Total I/O In: 360 [P.O.:360] Out: -   Labs:  Recent Labs  05/16/15 1504 05/17/15 0234 05/18/15 0436 05/19/15 0739  HGB 13.9 12.5* 12.0* 12.1*    Recent Labs  05/18/15 0436 05/19/15 0739  WBC 7.0 7.8  RBC 3.75* 3.69*  HCT 35.6* 35.1*  PLT 187 224    Recent Labs  05/17/15 0234 05/18/15 0436  NA 134* 134*  K 4.1 3.1*  CL 103 100*  CO2 22 25  BUN 6 6  CREATININE 0.73 0.80  GLUCOSE 145* 106*  CALCIUM 8.1* 8.2*   No results for input(s): LABPT, INR in the last 72 hours.  Physical Exam: Neurologically intact ABD soft Intact pulses distally Incision: dressing C/D/I and no drainage Compartment soft  Assessment/Plan: 3 Days Post-Op Procedure(s) (LRB): INTRAMEDULLARY (IM) NAIL INTERTROCHANTRIC (Left) Enema/Mag citrate for constipation PT today Plan on d/c to home tomorrow     Valoria Tamburri D for Dr. Venita Lickahari [facility]ke [facility] [phone] 05/19/2015, 11:00 AM

## 2015-05-19 NOTE — Progress Notes (Signed)
Occupational Therapy Treatment Patient Details Name: MIKI BLANK MRN: [id number] DOB: Nov 25, 1980 Today's Date: 05/19/2015    History of present illness Admitted after fall down stairs resulting in L proximal humerus fx (NWB, sling), and L hip fx, now s/p ORIF L hip, PWB   OT comments  This 35 yo male doing much better with bed mobility and transfers today. Education completed with mother and pt on UBB/D and LBB/D. Tub transfer not addressed this time because no feasible until pt gets more mobile--family are looking into getting a tub bench for later use. Only exercise for LUE at this time is elbow to keep it from getting so stiff. Pt is moving is wrist to digits without issues and using his hand for small tasks. Need to see pt 1 more session to practice 3n1>W/C or recliner transfers going to his right.  Follow Up Recommendations  No OT follow up (due to no insurance , so cannot get--would however benefit from follow up therapy)    Equipment Recommendations  3 in 1 bedside comode;Hospital bed;Wheelchair cushion (measurements OT);Wheelchair (measurements OT);Other (comment)       Precautions / Restrictions Precautions Precautions: Fall Precaution Comments: premedicate for pain Required Braces or Orthoses: Sling Restrictions Weight Bearing Restrictions: Yes LUE Weight Bearing: Non weight bearing LLE Weight Bearing: Partial weight bearing LLE Partial Weight Bearing Percentage or Pounds: just under 50% per MD       Mobility Bed Mobility Overal bed mobility: Needs Assistance Bed Mobility: Supine to Sit     Supine to sit: Min assist;HOB elevated (use of rail; support A at his back while he was coming up to sit)        Transfers Overall transfer level: Needs assistance Equipment used: None Transfers: Squat Pivot Transfers     Squat pivot transfers: Min guard     General transfer comment: Bed>recliner going to his right        ADL Overall ADL's : Needs  assistance/impaired                                       General ADL Comments: Post op shoudler ADL handout given with adjustments made based off pt not surgical but conservative management for shoulder fx. Educated pt and mother on LBD, UBB/D techniques (pt intially will go as simple as possible for ease of care); educated on use of wet wipes for back peri care post bowel movement with mom A'ing prn; educated on elbow exercises;  educated  mother giving him support A at his back when his is getting up OOB on his right with use of HOB up, rail, and leg lifter                Cognition   Behavior During Therapy: WFL for tasks assessed/performed Overall Cognitive Status: Within Functional Limits for tasks assessed                                    Pertinent Vitals/ Pain       Pain Assessment: 0-10 Pain Score: 4  Pain Location: LUE and LLE Pain Descriptors / Indicators: Aching;Guarding;Sore Pain Intervention(s): Monitored during session;Repositioned         Frequency Min 3X/week     Progress Toward Goals  OT Goals(current goals can now be found in the care plan section)  Progress towards OT goals: Progressing toward goals     Plan Discharge plan needs to be updated       End of Session Equipment Utilized During Treatment:  (sling)   Activity Tolerance Patient tolerated treatment well   Patient Left in chair;with call bell/phone within reach;with family/visitor present   Nurse Communication Mobility status        Time: [id number] OT Time Calculation (min): 50 min  Charges: OT General Charges $OT Visit: 1 Procedure OT Treatments $Self Care/Home Management : 38-52 mins  Evette GeorgesLeonard, Fizza Scales Eva [phone] 05/19/2015, 11:38 AM

## 2015-05-19 NOTE — Care Management Note (Signed)
Case Management Note  Patient Details  Name: John Ritter MRN: [id number] Date of Birth: [birth year]  Subjective/Objective:    35 yr old male s/p IM Nailing of left hip fracture, has left humerus fracture, left arm in sling, no surgery.     Action/Plan: Case manager spoke with patient and his mother [name] concerning need for hospital bed, drop arm 3in1 and a wheelchair with elevating leg rest. She states she will be assisting patient at discharge. Unfortunately patient will not have home health services, due to lack of insurance coverage.    Expected Discharge Date:     05/20/15             Expected Discharge Plan:  Home/Self Care  In-House Referral:     Discharge planning Services  CM Consult  Post Acute Care Choice:  Durable Medical Equipment Choice offered to:     DME Arranged:  3-N-1, Hospital bed, Wheelchair manual DME Agency:  [facility]  HH Arranged:  NA HH Agency:     Status of Service:  Completed, signed off  Medicare Important Message Given:    Date Medicare IM Given:    Medicare IM give by:    Date Additional Medicare IM Given:    Additional Medicare Important Message give by:     If discussed at Long Length of Stay Meetings, dates discussed:    Additional Comments:  Durenda GuthrieBrady, [name] Naomi, RN 05/19/2015, 4:02 PM

## 2015-05-19 NOTE — Progress Notes (Signed)
Physical Therapy Treatment Patient Details Name: John Ritter MRN: [id number] DOB: 07-Nov-1980 Today's Date: 05/19/2015    History of Present Illness Admitted after fall down stairs resulting in L proximal humerus fx (NWB, sling), and L hip fx, now s/p ORIF L hip, PWB    PT Comments    Making very good progress with transfers, and able to propel self in wheelchair today; Able to rise to almost standing with pivot transfer -- can clear armrest, and therefore drop-arm BSC isn't necessary; On track for dc tomorrow; I agree with medical Zenaida Niece transport home  Noted he will not be getting HHPT services; Provided pt with handout for L hip therex which he can complete independently, using leg lifter as necessary   Follow Up Recommendations  Supervision - Intermittent The potential need for Outpatient PT can be addressed at Ortho follow-up appointments.      Equipment Recommendations  3in1 (PT);Wheelchair (measurements PT);Wheelchair cushion (measurements PT) (tub transfer bench)    Recommendations for Other Services       Precautions / Restrictions Precautions Precautions: Fall Precaution Comments: premedicate for pain Required Braces or Orthoses: Sling Restrictions LUE Weight Bearing: Non weight bearing LLE Weight Bearing: Partial weight bearing LLE Partial Weight Bearing Percentage or Pounds: just under 50% per MD    Mobility  Bed Mobility                  Transfers Overall transfer level: Needs assistance Equipment used: None Transfers: Sit to/from [employer] Sit to Stand: Min guard Stand pivot transfers: Min guard       General transfer comment: Transferred recliner to wheelchair, and then wheelchair to recliner, both going to his right; Good rise onto RLE, and was on his RLE and moving foot heel-toe to target surface each time; stood at least 15-20 seconds during transfers; cues to breathe  Ambulation/Gait                 Stairs             Wheelchair Mobility    Modified Rankin (Stroke Patients Only)       Balance     Sitting balance-Leahy Scale: Fair                              Cognition Arousal/Alertness: Awake/alert Behavior During Therapy: WFL for tasks assessed/performed Overall Cognitive Status: Within Functional Limits for tasks assessed                      Exercises General Exercises - Lower Extremity     General Comments General comments (skin integrity, edema, etc.): Cues for wheelchair management (brakes, legrests); Ian Malkin is relatively well-versed in using wheelchair as he assisted his grandmother often; Able to propel self around unit with RUE on Wheel and R foot on floor steering      Pertinent Vitals/Pain Pain Assessment: 0-10 Pain Score: 4  Pain Location: LUE and LLE Pain Descriptors / Indicators: Aching;Guarding;Sore Pain Intervention(s): Monitored during session;Repositioned    Home Living                      Prior Function            PT Goals (current goals can now be found in the care plan section) Acute Rehab PT Goals Patient Stated Goal: less pain; States he would like to dc to home if at all possible  PT Goal Formulation: With patient Time For Goal Achievement: 05/31/15 Potential to Achieve Goals: Good Progress towards PT goals: Progressing toward goals    Frequency  Min 6X/week    PT Plan Discharge plan needs to be updated    Co-evaluation             End of Session   Activity Tolerance: Patient tolerated treatment well Patient left: in chair;with call bell/phone within reach     Time: 1156-1235 (minus 6 minutes) PT Time Calculation (min) (ACUTE ONLY): 39 min  Charges:  $Therapeutic Activity: 8-22 mins $Wheel Chair Management: 8-22 mins                    G Codes:      Olen PelGarrigan, Rodina Pinales Hamff 05/19/2015, 4:28 PM  Van ClinesHolly Gracieann Stannard, [place]PT  Acute Rehabilitation Services Pager [phone] Office [phone]

## 2015-05-20 NOTE — Progress Notes (Signed)
Patient ID: Collene SchlichterZachariah D Barbeau, male   DOB: 11-03-1980, 35 y.o.   MRN: [id number]    Subjective: 4 Days Post-Op Procedure(s) (LRB): INTRAMEDULLARY (IM) NAIL INTERTROCHANTRIC (Left) Patient reports pain as 3 on 0-10 scale.   Denies CP or SOB.  Voiding without difficulty. Positive BM Objective: Vital signs in last 24 hours: Temp:  [98.2 F (36.8 C)-98.4 F (36.9 C)] 98.2 F (36.8 C) (03/23 0500) Pulse Rate:  [84-90] 90 (03/23 0500) Resp:  [16-18] 18 (03/23 0500) BP: (108-110)/(74-76) 110/74 mmHg (03/23 0500) SpO2:  [97 %] 97 % (03/23 0500)  Intake/Output from previous day: 03/22 0701 - 03/23 0700 In: 960 [P.O.:960] Out: 250 [Urine:250] Intake/Output this shift: Total I/O In: 240 [P.O.:240] Out: -   Labs:  Recent Labs  05/18/15 0436 05/19/15 0739  HGB 12.0* 12.1*    Recent Labs  05/18/15 0436 05/19/15 0739  WBC 7.0 7.8  RBC 3.75* 3.69*  HCT 35.6* 35.1*  PLT 187 224    Recent Labs  05/18/15 0436  NA 134*  K 3.1*  CL 100*  CO2 25  BUN 6  CREATININE 0.80  GLUCOSE 106*  CALCIUM 8.2*   No results for input(s): LABPT, INR in the last 72 hours.  Physical Exam: Neurologically intact ABD soft Sensation intact distally Incision: no drainage Compartment soft Left UE in a sling  Assessment/Plan: 4 Days Post-Op Procedure(s) (LRB): INTRAMEDULLARY (IM) NAIL INTERTROCHANTRIC (Left) DC home today Pt waiting for his medical equipment to be delivered to his house Pts family is contacting a taxi company to [facility]  Mayo, Baxter Kailarmen Christina for Dr. Venita Lickahari [facility] [phone] 05/20/2015, 11:40 AM

## 2015-05-20 NOTE — Progress Notes (Signed)
Pt discharged to home via his home wheelchair with sister without incident per MD order.

## 2015-05-20 NOTE — Progress Notes (Signed)
OT Cancellation Note  Patient Details Name: Collene SchlichterZachariah D Busby MRN: [id number] DOB: 05-18-1980   Cancelled Treatment:    Reason Eval/Treat Not Completed: Patient declined, no reason specified. Pt stated he would like to save his energy if he is discharging today. Pt reported that he has practiced stand-pivot transfers to his R side multiple times with PT which I confirmed with PTA. Pt reported he may need assistance getting dressed later once his mother brings him some clothes. Will attempt to see later today if time allows.  Nils PyleJulia Tatjana Turcott, OTR/L Pager: [phone] 05/20/2015, 1:29 PM

## 2015-05-20 NOTE — Progress Notes (Signed)
All discharge teachings done with pt and sister. All questions answered. Both verb understanding of teachings and agree to comply. Pt instructed this AM how to give Lovenox. Pt was able to verbalize understanding of administration and able to return demonstrate.

## 2015-05-20 NOTE — Care Management (Signed)
Case manager [facility]. Explained to patient how MATCH program works and provided [facility].

## 2015-05-20 NOTE — Progress Notes (Signed)
Physical Therapy Treatment Patient Details Name: John SchlichterZachariah D Mckinlay MRN: [id number] DOB: 19-Aug-1980 Today's Date: 05/20/2015    History of Present Illness Admitted after fall down stairs resulting in L proximal humerus fx (NWB, sling), and L hip fx, now s/p ORIF L hip, PWB    PT Comments    Patient is making good progress with PT.  From a mobility standpoint anticipate patient will be ready for DC home with assistance from mother and friends when medically ready.      Follow Up Recommendations  Supervision - Intermittent     Equipment Recommendations  3in1 (PT);Wheelchair (measurements PT);Wheelchair cushion (measurements PT) (tub transfer bench)    Recommendations for Other Services       Precautions / Restrictions Precautions Precautions: Fall Precaution Comments: premedicate for pain Required Braces or Orthoses: Sling Restrictions Weight Bearing Restrictions: Yes LUE Weight Bearing: Non weight bearing LLE Weight Bearing: Partial weight bearing LLE Partial Weight Bearing Percentage or Pounds: 50%    Mobility  Bed Mobility Overal bed mobility: Needs Assistance Bed Mobility: Supine to Sit;Sit to Supine     Supine to sit: Min guard;HOB elevated Sit to supine: Min guard   General bed mobility comments: min guard for safety; pt used leg lifter to mobilize L LE off/on EOB; cues and demo of technique with and withou HOB elevated; increased time needed; cues to breathe  Transfers Overall transfer level: Needs assistance Equipment used: None Transfers: Sit to/from Stand Sit to Stand: Min guard Stand pivot transfers: Min guard       General transfer comment: multiple transfers EOB to/from w/c and w/c to/from recliner; cues and demo for positioning of w/c, hand placement, and technique  Ambulation/Gait                 Stairs            Wheelchair Mobility    Modified Rankin (Stroke Patients Only)       Balance Overall balance assessment: Needs  assistance Sitting-balance support: Feet supported;Single extremity supported Sitting balance-Leahy Scale: Fair                              Cognition Arousal/Alertness: Awake/alert Behavior During Therapy: WFL for tasks assessed/performed Overall Cognitive Status: Within Functional Limits for tasks assessed                      Exercises      General Comments General comments (skin integrity, edema, etc.): maintained precuations throughout session; reviewed HEP, use of ice, and positioning      Pertinent Vitals/Pain Pain Assessment: Faces Faces Pain Scale: Hurts little more Pain Location: L LE with mobility Pain Descriptors / Indicators: Sore Pain Intervention(s): Limited activity within patient's tolerance;Monitored during session;Premedicated before session;Repositioned;Ice applied    Home Living                      Prior Function            PT Goals (current goals can now be found in the care plan section) Acute Rehab PT Goals Patient Stated Goal: go home today PT Goal Formulation: With patient Time For Goal Achievement: 05/31/15 Potential to Achieve Goals: Good Progress towards PT goals: Progressing toward goals    Frequency  Min 6X/week    PT Plan Current plan remains appropriate    Co-evaluation  End of Session Equipment Utilized During Treatment: Gait belt Activity Tolerance: Patient tolerated treatment well Patient left: in chair;with call bell/phone within reach     Time: 0932-1010 PT Time Calculation (min) (ACUTE ONLY): 38 min  Charges:  $Therapeutic Activity: 38-52 mins                    G Codes:      Derek Mound, PTA Pager: [phone]   05/20/2015, 10:27 AM

## 2015-05-24 ENCOUNTER — Encounter (HOSPITAL_COMMUNITY): Payer: Self-pay | Admitting: Orthopedic Surgery

## 2015-05-28 NOTE — Discharge Summary (Signed)
Patient ID: John Ritter MRN: [id number] DOB/AGE: 04/25/1980 35 y.o.  Admit date: 05/16/2015 Discharge date: 05/28/2015  Admission Diagnoses:  Active Problems:   Hip fracture [facility])   Discharge Diagnoses:  Active Problems:   Hip fracture (HCC)  status post Procedure(s): INTRAMEDULLARY (IM) NAIL INTERTROCHANTRIC  Past Medical History  Diagnosis Date  . Allergy     cat and dog  . Arm fracture, left     Surgeries: Procedure(s): INTRAMEDULLARY (IM) NAIL INTERTROCHANTRIC on 05/16/2015   Consultants:    Discharged Condition: Improved  Hospital Course: John Ritter is an 35 y.o. male who was admitted 05/16/2015 for operative treatment of Intertrochanteric Fracture. Patient failed conservative treatments (please see the history and physical for the specifics) and had severe unremitting pain that affects sleep, daily activities and work/hobbies. After pre-op clearance, the patient was taken to the operating room on 05/16/2015 and underwent  Procedure(s): INTRAMEDULLARY (IM) NAIL INTERTROCHANTRIC. Pt discharged on 05/20/15.  Patient was given perioperative antibiotics:  Anti-infectives    Start     Dose/Rate Route Frequency Ordered Stop   05/16/15 2345  ceFAZolin (ANCEF) IVPB 2 g/50 mL premix     2 g 100 mL/hr over 30 Minutes Intravenous Every 6 hours 05/16/15 2116 05/17/15 0545   05/16/15 1700  ceFAZolin (ANCEF) powder 2 g  Status:  Discontinued     2 g Other To Surgery 05/16/15 1651 05/16/15 2105       Patient was given sequential compression devices and early ambulation to prevent DVT.   Patient benefited maximally from hospital stay and there were no complications. At the time of discharge, the patient was urinating/moving their bowels without difficulty, tolerating a regular diet, pain is controlled with oral pain medications and they have been cleared by PT/OT.   Recent vital signs: No data found.    Recent laboratory studies: No results for input(s): WBC,  HGB, HCT, PLT, NA, K, CL, CO2, BUN, CREATININE, GLUCOSE, INR, CALCIUM in the last 72 hours.  Invalid input(s): PT, 2   Discharge Medications:     Medication List    TAKE these medications        calcium carbonate 500 MG chewable tablet  Commonly known as:  TUMS - dosed in mg elemental calcium  Chew 2 tablets by mouth 2 (two) times daily.     enoxaparin 40 MG/0.4ML injection  Commonly known as:  LOVENOX  Inject 0.4 mLs (40 mg total) into the skin daily.     ibuprofen 200 MG tablet  Commonly known as:  ADVIL,MOTRIN  Take 600 mg by mouth every 6 (six) hours as needed.     methocarbamol 500 MG tablet  Commonly known as:  ROBAXIN  Take 1 tablet (500 mg total) by mouth 3 (three) times daily as needed for muscle spasms.     ondansetron 4 MG tablet  Commonly known as:  ZOFRAN  Take 1 tablet (4 mg total) by mouth every 8 (eight) hours as needed for nausea or vomiting.     oxyCODONE-acetaminophen 10-325 MG tablet  Commonly known as:  PERCOCET  Take 1 tablet by mouth every 4 (four) hours as needed for pain.        Diagnostic Studies: Dg Chest Portable 1 View  05/16/2015  CLINICAL DATA:  Left hip fracture.  Preop evaluation. EXAM: PORTABLE CHEST 1 VIEW COMPARISON:  None. FINDINGS: The heart size and mediastinal contours are within normal limits. Both lungs are clear. The visualized skeletal structures are unremarkable. IMPRESSION: Normal  examination. Electronically Signed   By: Beckie SaltsSteven  Reid M.D.   On: 05/16/2015 15:24   Dg Shoulder Left  05/16/2015  CLINICAL DATA:  Fall down a flight of stairs last night, pain in left hip and left shoulder EXAM: LEFT SHOULDER - 2+ VIEW COMPARISON:  None. FINDINGS: There is a comminuted minimally displaced fracture involving the greater tuberosity of the left humerus. No dislocation or subluxation. Visualized portion of the left lung apex is unremarkable. IMPRESSION: Comminuted fracture of the left proximal humerus. Electronically Signed   By: Norva PavlovElizabeth   Brown M.D.   On: 05/16/2015 14:37   Dg C-arm 1-60 Min  05/16/2015  CLINICAL DATA:  Intra medullary nail left femur EXAM: DG C-ARM 61-120 MIN; DG HIP (WITH OR WITHOUT PELVIS) 2-3V LEFT COMPARISON:  05/16/2015 FINDINGS: Fluoroscopy Time:  1 minutes 55 seconds Number of Acquired Images:  7 Again identified is a mildly displaced intertrochanteric left femur fracture. There is an intra medullary nail placed in the left femur from the greater trochanter into the midshaft with cannulated interlocking screw through the left femoral neck. IMPRESSION: ORIF left femur fracture Electronically Signed   By: Esperanza Heiraymond  Rubner M.D.   On: 05/16/2015 19:40   Dg Hip Unilat With Pelvis 2-3 Views Left  05/16/2015  CLINICAL DATA:  Intra medullary nail left femur EXAM: DG C-ARM 61-120 MIN; DG HIP (WITH OR WITHOUT PELVIS) 2-3V LEFT COMPARISON:  05/16/2015 FINDINGS: Fluoroscopy Time:  1 minutes 55 seconds Number of Acquired Images:  7 Again identified is a mildly displaced intertrochanteric left femur fracture. There is an intra medullary nail placed in the left femur from the greater trochanter into the midshaft with cannulated interlocking screw through the left femoral neck. IMPRESSION: ORIF left femur fracture Electronically Signed   By: Esperanza Heiraymond  Rubner M.D.   On: 05/16/2015 19:40   Dg Hip Unilat With Pelvis 2-3 Views Left  05/16/2015  CLINICAL DATA:  Left hip pain after falling down stairs last night. EXAM: DG HIP (WITH OR WITHOUT PELVIS) 2-3V LEFT COMPARISON:  None. FINDINGS: Minimally comminuted left intertrochanteric fracture. No significant displacement or angulation. IMPRESSION: Minimally comminuted left intertrochanteric fracture. Electronically Signed   By: Beckie SaltsSteven  Reid M.D.   On: 05/16/2015 14:36          Follow-up Information    Follow up with Alvy BealBROOKS,DAHARI D, MD In 10 days.   Specialty:  Orthopedic Surgery   Why:  For suture removal, For wound re-check, If symptoms worsen   Contact information:   [address] [place] [address] [phone]       Discharge Plan:  discharge to home  Disposition:     Signed: Kirt BoysMayo, Carmen Christina for Dr. Venita Lickahari [facility] [phone] 05/28/2015, 1:47 PM

## 2015-07-19 ENCOUNTER — Ambulatory Visit: Payer: Self-pay | Attending: Orthopedic Surgery | Admitting: Physical Therapy

## 2015-07-19 DIAGNOSIS — R2689 Other abnormalities of gait and mobility: Secondary | ICD-10-CM | POA: Insufficient documentation

## 2015-07-19 DIAGNOSIS — M25552 Pain in left hip: Secondary | ICD-10-CM | POA: Insufficient documentation

## 2015-07-19 NOTE — Therapy (Addendum)
[facility] [address] [place], [place], [address] Phone: [phone]   Fax:  [phone]  Physical Therapy Evaluation/Discharge  Patient Details  Name: John Ritter MRN: [id number] Date of Birth: 1981/02/20 Referring Provider: Venita Ritter   Encounter Date: 07/19/2015      PT End of Session - 07/19/15 0959    Visit Number 1   Number of Visits 1   PT Start Time 0847   PT Stop Time 0925   PT Time Calculation (min) 38 min   Activity Tolerance Patient tolerated treatment well   Behavior During Therapy [facility] for tasks assessed/performed;Restless      Past Medical History  Diagnosis Date  . Allergy     cat and dog  . Arm fracture, left     Past Surgical History  Procedure Laterality Date  . Intramedullary (im) nail intertrochanteric Left 05/16/2015    Procedure: INTRAMEDULLARY (IM) NAIL INTERTROCHANTRIC;  Surgeon: John Lick, MD;  Location: [facility] OR;  Service: Orthopedics;  Laterality: Left;    There were no vitals filed for this visit.       Subjective Assessment - 07/19/15 0852    Subjective Pt fell down the steps at a friends house on 05/16/15 and broke his L hip.  He was taken to surgery that day, there were no complications and he reports improving pain and function.  He would like to learn how to strengthen on his own.    Pertinent History arm fracture reported by patient, healing normally   Limitations Lifting;Walking;Standing   How long can you sit comfortably? as long as needed   How long can you stand comfortably? pain increases but only to min after about 30 min    How long can you walk comfortably? did not report, pain is min    Patient Stated Goals To strengthen on my own.    Currently in Pain? No/denies   Pain Score 3   usually no higher that    Pain Location Hip   Pain Orientation Left   Pain Descriptors / Indicators Sharp   Pain Type Surgical pain   Pain Onset More than a month ago   Pain  Frequency Several days a week   Aggravating Factors  standing, walking and moving.    Pain Relieving Factors does not have to take meds, just rests when he needs to    Multiple Pain Sites No            [facility] PT Assessment - 07/19/15 0858    Assessment   Medical Diagnosis L femur IM naiing fracture    Referring Provider John Ritter    Precautions   Precautions None   Restrictions   Weight Bearing Restrictions No   Balance Screen   Has the patient fallen in the past 6 months Yes   How many times? Just the incident    Has the patient had a decrease in activity level because of a fear of falling?  No   Is the patient reluctant to leave their home because of a fear of falling?  No   Home Tourist information centre manager residence   Prior Function   Level of Independence Independent   Cognition   Overall Cognitive Status Within Functional Limits for tasks assessed   Sensation   Light Touch Appears Intact   Squat   Comments WNL   Single Leg Stance   Comments leans to L in L SLS   PROM  Overall PROM  Other (comment)  Rt. WNL.    PROM Assessment Site Hip   Right/Left Hip Left   Left Hip Extension 20   Left Hip Flexion 60  KTC 100 deg min pain    Left Hip External Rotation  45  min pain    Left Hip Internal Rotation  20  min pain    Strength   Right Hip Flexion 5/5   Right Hip Extension 5/5   Right Hip ABduction 5/5   Left Hip Flexion 4+/5   Left Hip Extension 4+/5   Left Hip ABduction 4/5   Right Knee Flexion 5/5   Right Knee Extension 5/5   Left Knee Flexion 5/5   Left Knee Extension 5/5   Palpation   Palpation comment non tender lateral hip, glutes, some soreness lateral prox hamstring                    [facility] Adult PT Treatment/Exercise - 07/19/15 1038    Self-Care   Self-Care Posture   Posture SLS, HEP, PT and function, insurance and payment questions   Lumbar Exercises: Sidelying   Clam 20 reps   Hip Abduction 10 reps   Knee/Hip  Exercises: Standing   Hip Abduction Stengthening;Left;1 set;10 reps   Hip Extension Stengthening;Left;1 set;10 reps                PT Education - 07/19/15 0958    Education provided Yes   Education Details PT/POC, HEP, lateal hip stability for gait    Person(s) Educated Patient   Methods Explanation;Demonstration;Tactile cues;Handout;Verbal cues   Comprehension Verbalized understanding;Returned demonstration          PT Short Term Goals - 07/19/15 1037    PT SHORT TERM GOAL #1   Title Patient will be given HEP for LLE strength and demo    Time 1   Period Days   Status Achieved           PT Long Term Goals - 07/19/15 1037    PT LONG TERM GOAL #1   Title NA               Plan - 07/19/15 1021    Clinical Impression Statement This patient presents for low complexity eval for L femur IM nailing (05/16/15) to correct a prox femur fracture and intertrochanteric hip fracture.  He has minimal weakness in LLE and walks with a limp, no assistive device. He is not functionally limited by his pain, can manage ADLs and mobility without the use of pain meds.  He was given an HEP to address L lateral hip weakness and balance.     Rehab Potential Excellent   PT Frequency One time visit   PT Treatment/Interventions Patient/family education;Therapeutic exercise   PT Next Visit Plan NA   PT Home Exercise Plan NA   Recommended Other Services NA   Consulted and Agree with Plan of Care Patient      Patient will benefit from skilled therapeutic intervention in order to improve the following deficits and impairments:  Abnormal gait, Decreased strength, Decreased mobility, Pain, Decreased range of motion  Visit Diagnosis: Other abnormalities of gait and mobility  Pain in left hip     Problem List Patient Active Problem List   Diagnosis Date Noted  . Hip fracture (HCC) 05/16/2015    John Ritter 07/19/2015, 10:40 AM  [facility] [address] [place], [place], [address] Phone: [phone]   Fax:  [phone]  Name: John Ritter MRN: [id number] Date of Birth: [birth year]   John MainlandJennifer Madiha Ritter, PT 07/19/2015 10:40 AM Phone: [phone] Fax: [phone]

## 2015-07-19 NOTE — Patient Instructions (Signed)
Knee to Chest (Flexion)    Pull knee toward chest. Feel stretch in lower back or buttock area. Breathing deeply, Hold ___30_ seconds. Repeat with other knee. Repeat __3__ times. Do __2__ sessions per day.  http://gt2.exer.us/225   Copyright  VHI. All rights reserved.     Alternating Step    Take alternating steps as quickly as possible. Repeat _10-20___ times. Do __1-2__ sessions per day.  http://gt2.exer.us/493   Copyright  VHI. All rights reserved.       ABDUCTION: Standing - Resistance Band (Active)    Stand, feet flat. Against green resistance band, lift right leg out to side. Complete ___1-2 sets of _10-20__ repetitions. Perform _1-2__ sessions per day.  http://gtsc.exer.us/116   Copyright  VHI. All rights reserved.   Abduction: Clam (Eccentric) - Side-Lying    Lie on side with knees bent. Lift top knee, keeping feet together. Keep trunk steady. Slowly lower for 3-5 seconds. _10__ reps per set, _1-2__ sets per day, __5_ days per week.   http://ecce.exer.us/64   Copyright  VHI. All rights reserved.

## 2017-08-02 IMAGING — CR DG HIP (WITH OR WITHOUT PELVIS) 2-3V*L*
3 series · 3 of 3 positions shown · non-contrast
Comparison: None.

CLINICAL DATA: Left hip pain after falling down stairs last night.

EXAM:
DG HIP (WITH OR WITHOUT PELVIS) 2-3V LEFT

[pelvis ap]
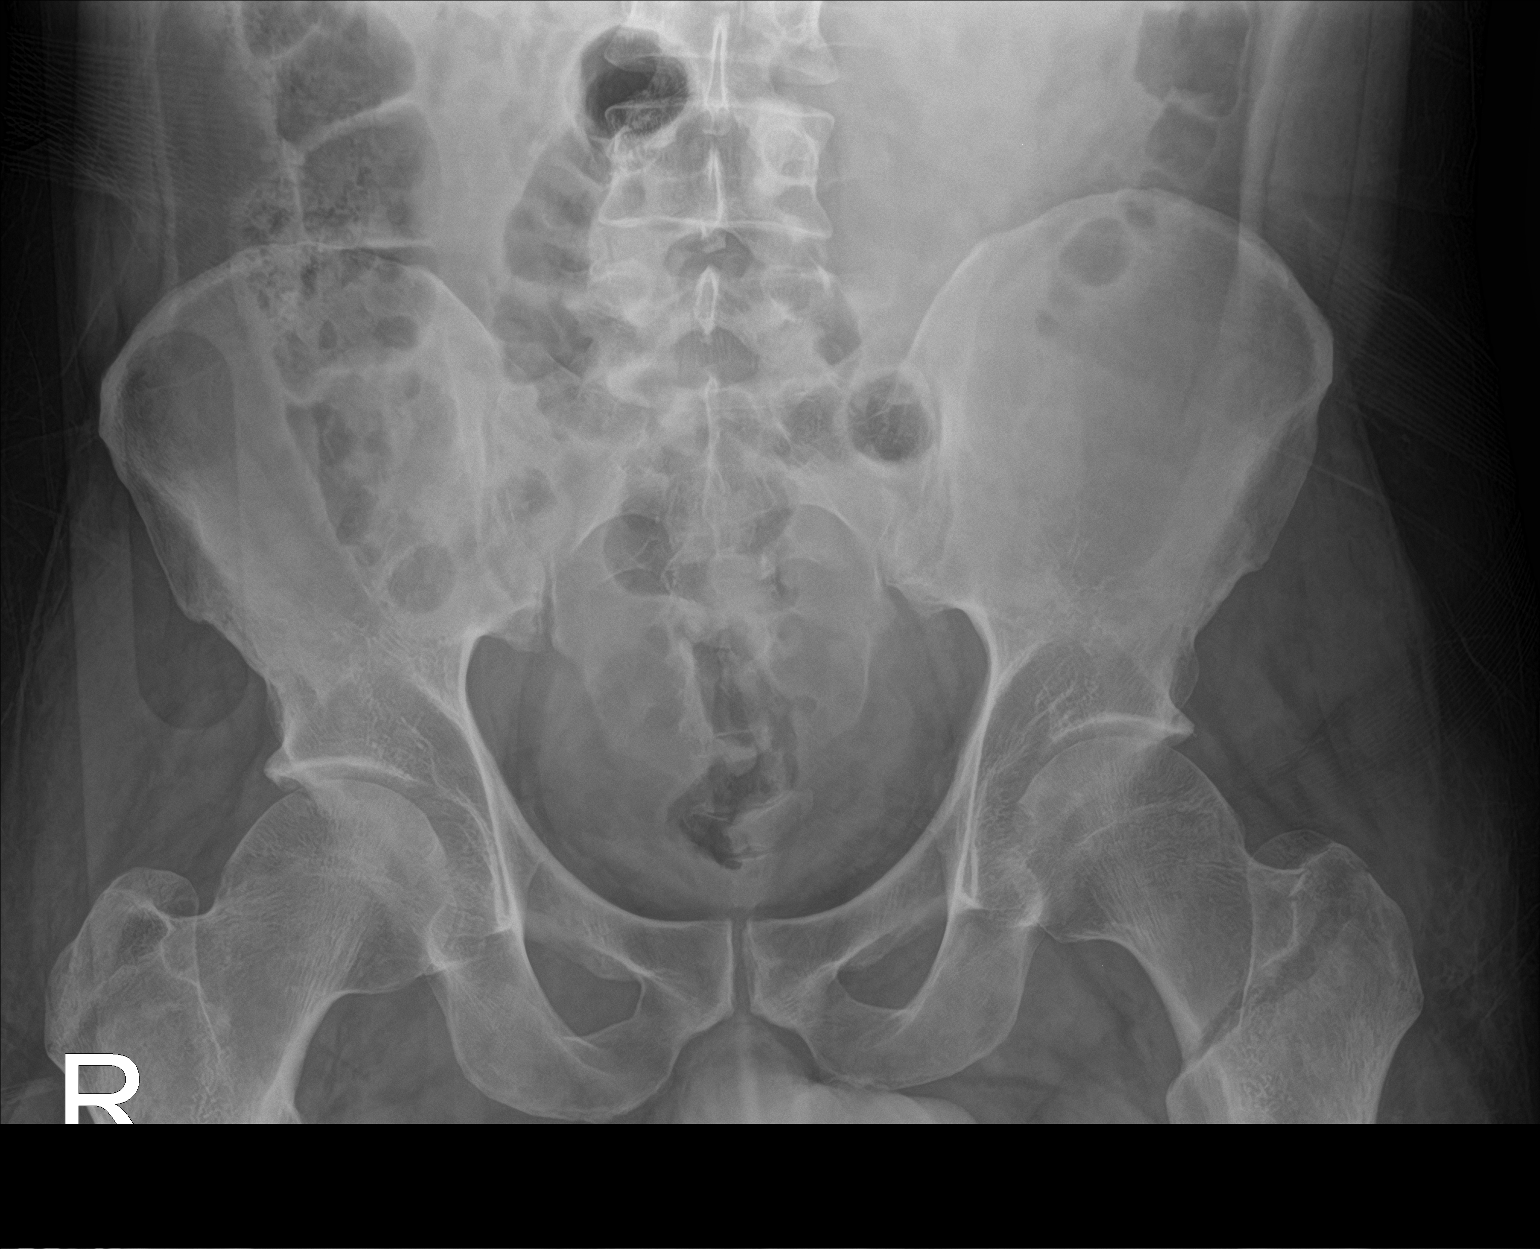

[hip ap]
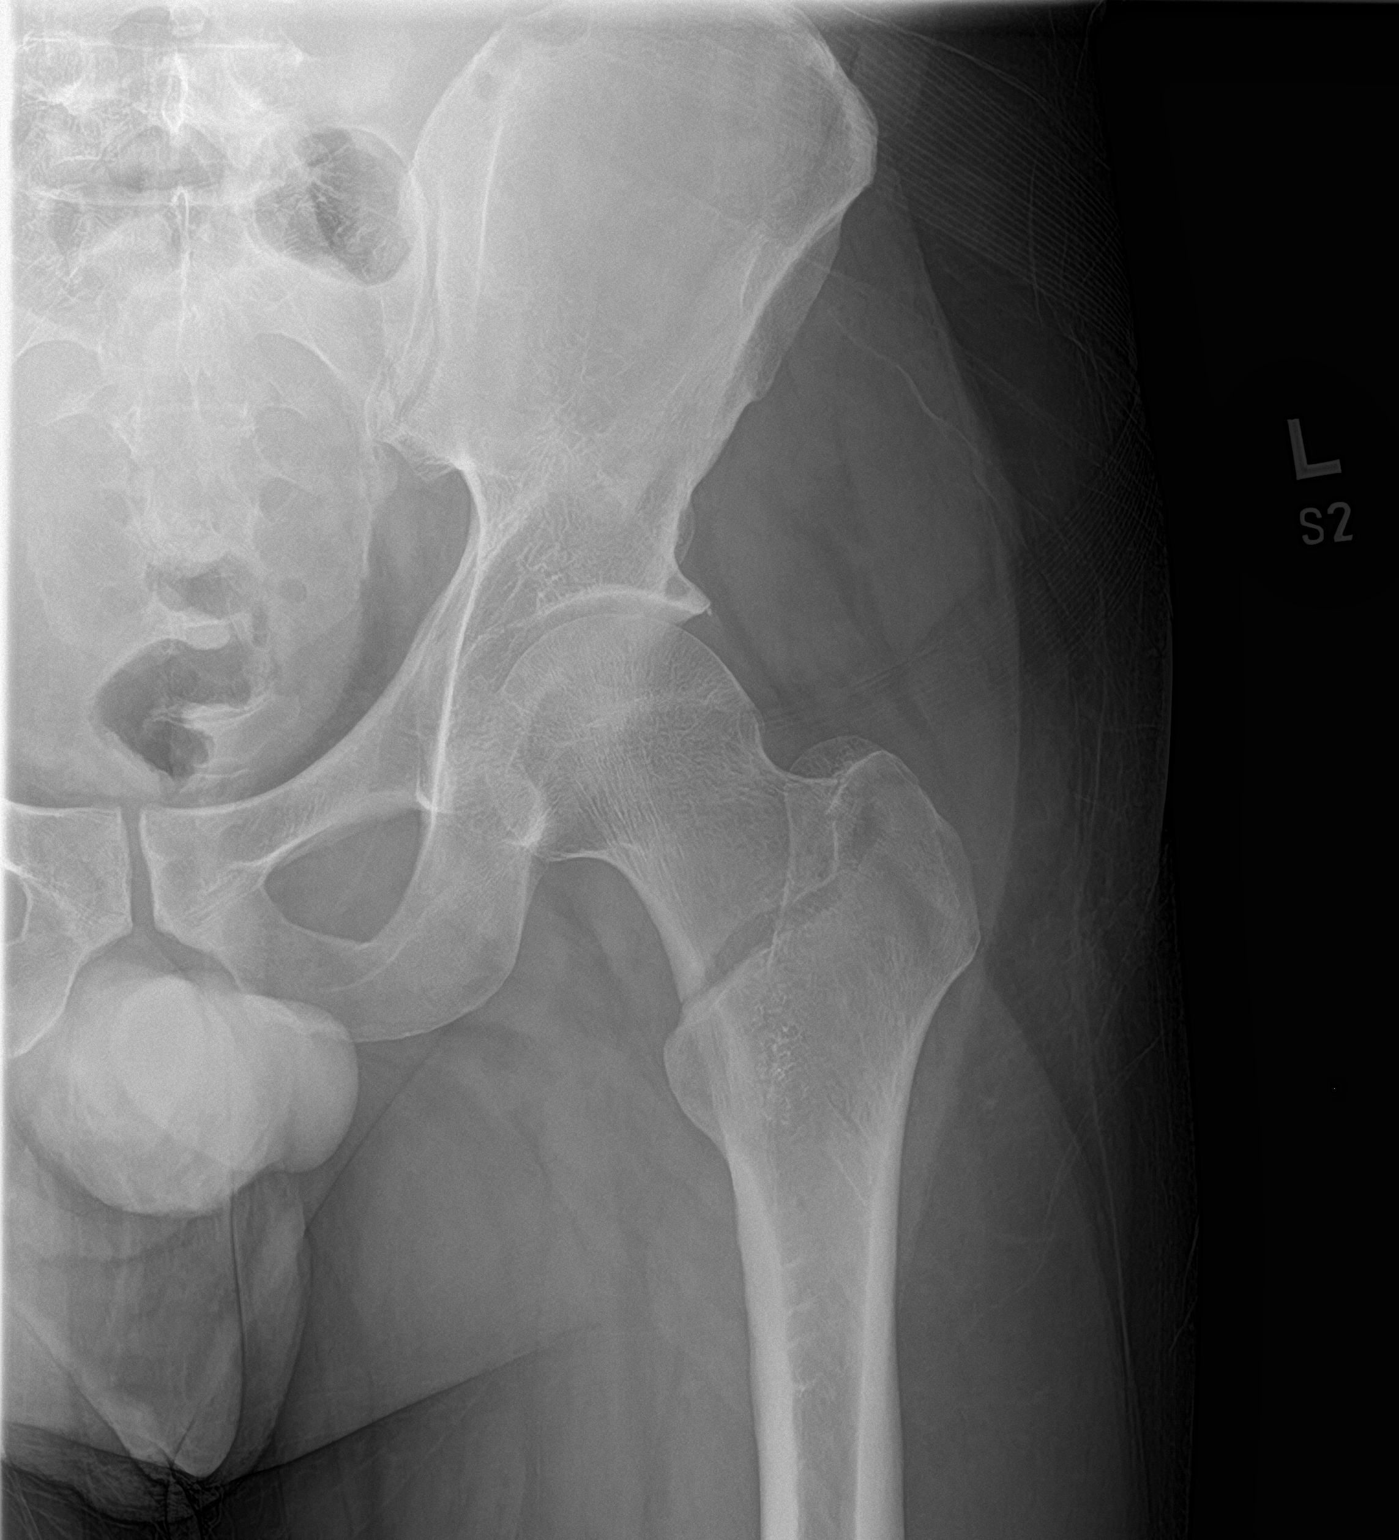

[hip lat]
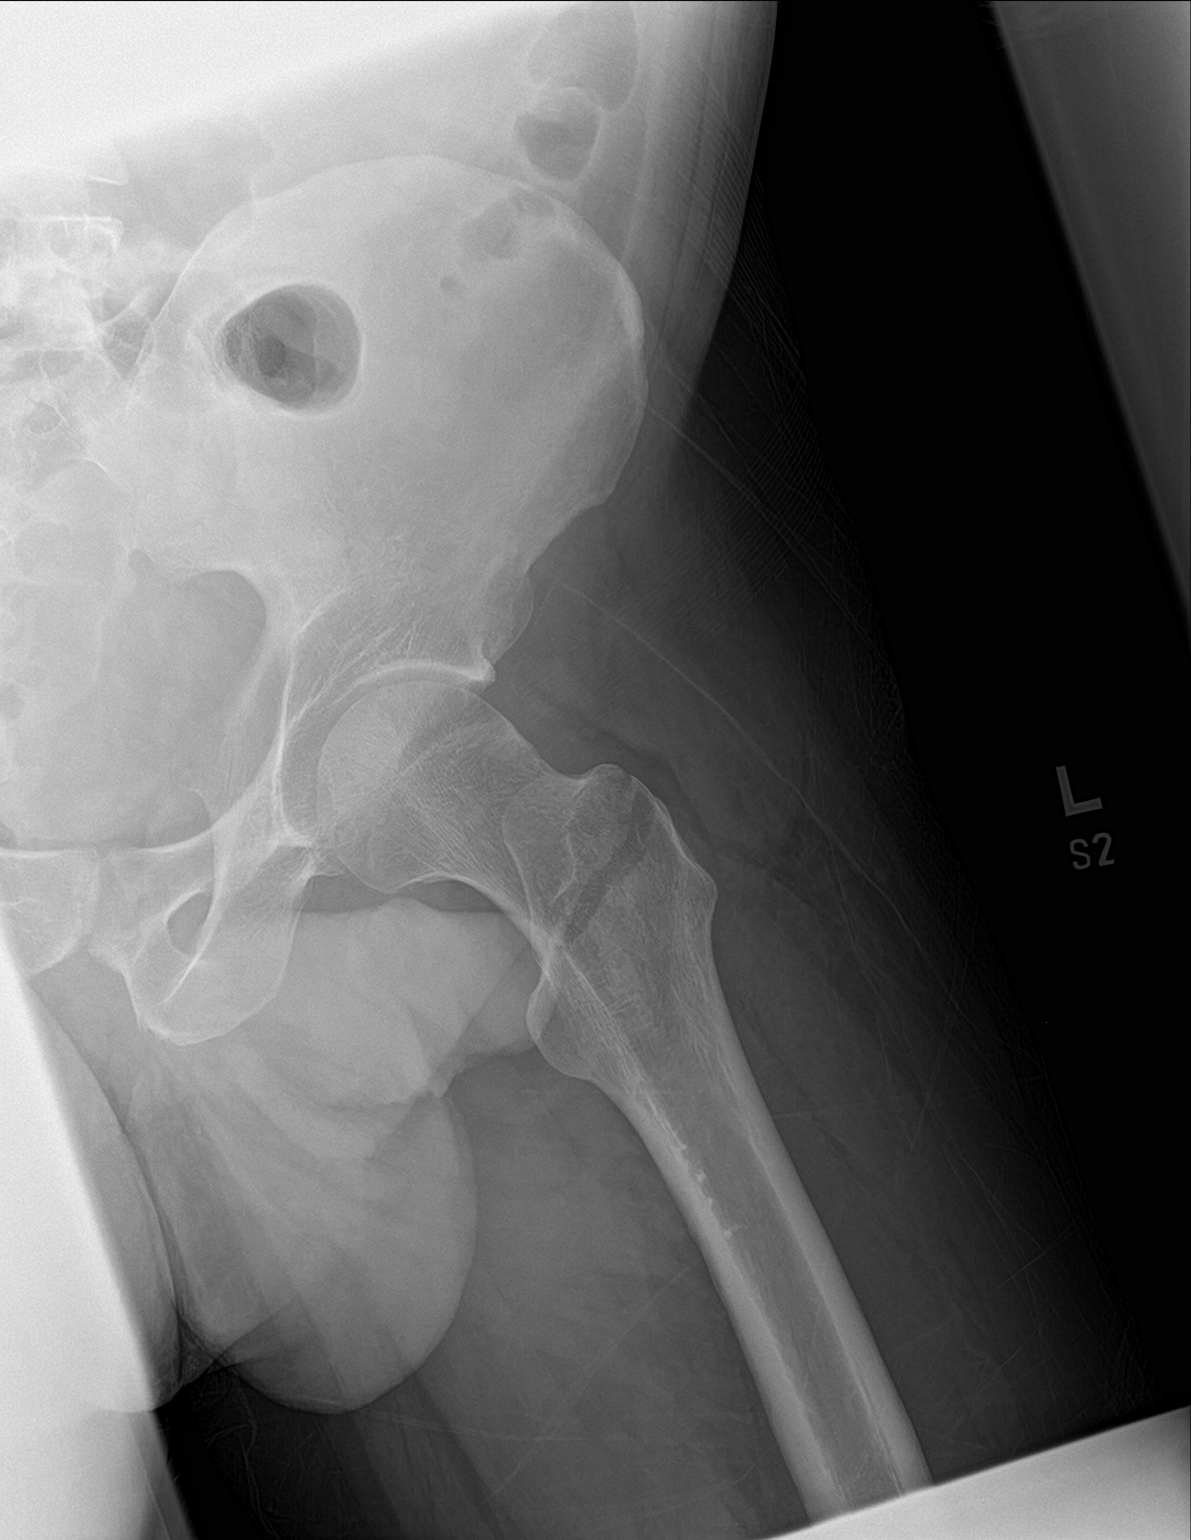

[3 of 3 positions shown; findings below may reference images not displayed]

FINDINGS: Minimally comminuted left intertrochanteric fracture. No significant
displacement or angulation.
IMPRESSION: Minimally comminuted left intertrochanteric fracture.

## 2022-07-26 ENCOUNTER — Other Ambulatory Visit: Payer: Self-pay

## 2022-07-26 ENCOUNTER — Emergency Department (HOSPITAL_COMMUNITY)
Admission: EM | Admit: 2022-07-26 | Discharge: 2022-07-27 | Disposition: A | Discharge status: Home / Self Care | Payer: Self-pay | Attending: Emergency Medicine | Admitting: Emergency Medicine

## 2022-07-26 ENCOUNTER — Encounter (HOSPITAL_COMMUNITY): Payer: Self-pay | Admitting: *Deleted

## 2022-07-26 DIAGNOSIS — M25571 Pain in right ankle and joints of right foot: Secondary | ICD-10-CM | POA: Insufficient documentation

## 2022-07-26 DIAGNOSIS — W57XXXA Bitten or stung by nonvenomous insect and other nonvenomous arthropods, initial encounter: Secondary | ICD-10-CM | POA: Insufficient documentation

## 2022-07-26 DIAGNOSIS — M25471 Effusion, right ankle: Secondary | ICD-10-CM | POA: Insufficient documentation

## 2022-07-26 LAB — COMPREHENSIVE METABOLIC PANEL
ALT: 35 U/L (ref 0–44)
AST: 52 U/L — ABNORMAL HIGH (ref 15–41)
Albumin: 3.9 g/dL (ref 3.5–5.0)
Alkaline Phosphatase: 89 U/L (ref 38–126)
Anion gap: 11 (ref 5–15)
BUN: <5 mg/dL — ABNORMAL LOW (ref 6–20)
CO2: 24 mmol/L (ref 22–32)
Calcium: 9.3 mg/dL (ref 8.9–10.3)
Chloride: 98 mmol/L (ref 98–111)
Creatinine, Ser: 0.72 mg/dL (ref 0.61–1.24)
GFR, Estimated: >60 mL/min (ref >60)
Glucose, Bld: 97 mg/dL (ref 70–99)
Potassium: 4.1 mmol/L (ref 3.5–5.1)
Sodium: 133 mmol/L — ABNORMAL LOW (ref 135–145)
Total Bilirubin: 0.9 mg/dL (ref 0.3–1.2)
Total Protein: 8.3 g/dL — ABNORMAL HIGH (ref 6.5–8.1)

## 2022-07-26 LAB — CBC
HCT: 42.7 % (ref 39.0–52.0)
Hemoglobin: 14.7 g/dL (ref 13.0–17.0)
MCH: 34.3 pg — ABNORMAL HIGH (ref 26.0–34.0)
MCHC: 34.4 g/dL (ref 30.0–36.0)
MCV: 99.5 fL (ref 80.0–100.0)
Platelets: 261 10*3/uL (ref 150–400)
RBC: 4.29 MIL/uL (ref 4.22–5.81)
RDW: 12.9 % (ref 11.5–15.5)
WBC: 9.8 10*3/uL (ref 4.0–10.5)
nRBC: 0 % (ref 0.0–0.2)

## 2022-07-26 LAB — URINALYSIS, ROUTINE W REFLEX MICROSCOPIC
Bacteria, UA: NONE SEEN
Bilirubin Urine: NEGATIVE
Glucose, UA: NEGATIVE mg/dL
Hgb urine dipstick: NEGATIVE
Ketones, ur: NEGATIVE mg/dL
Nitrite: NEGATIVE
Protein, ur: NEGATIVE mg/dL
Specific Gravity, Urine: 1.016 (ref 1.005–1.030)
pH: 7 (ref 5.0–8.0)

## 2022-07-26 MED ORDER — DOXYCYCLINE HYCLATE 100 MG PO TABS
100.0000 mg | ORAL_TABLET | Freq: Once | ORAL | Status: AC
  Administered 2022-07-27: 100 mg via ORAL
  Filled 2022-07-26: qty 1

## 2022-07-26 MED ORDER — DOXYCYCLINE HYCLATE 100 MG PO CAPS: 100.0000 mg | ORAL_CAPSULE | Freq: Two times a day (BID) | ORAL | 0 refills | Status: DC

## 2022-07-26 NOTE — Discharge Instructions (Signed)
I recommend that you have an ultrasound of your right calf to rule out blood clot due to the swelling of your ankle.  Since you are walking on it well, I doubt fracture.  There isn't any apparent infection.  I've ordered [facility] Spotted Fever and Lyme Disease labs, but these will not result tonight.  We will start you on treatment with Doxycycline.

## 2022-07-26 NOTE — ED Triage Notes (Signed)
The pt wants to be checked for rmsf he was camping this past weekend and found a small tick in his body  he reports that both his ankles are swollen  no other symptoms

## 2022-07-26 NOTE — ED Provider Notes (Signed)
[facility]Y DEPT [facility] Emergency Department Provider Note MRN:  [id number]  Arrival date & time: 07/26/22     Chief Complaint   Ankle Pain   History of Present Illness   John EMMEL is a 42 y.o. year-old male presents to the ED with chief complaint of right ankle swelling.  States that he was camping this weekend and was bitten by a tick.  He's not sure how long it was on him.  He denies fevers, chills, rash, arthralgias, or myalgias.  He denies any injury to his right foot or ankle.  He states that he was walking around a lot.  History provided by patient.   Review of Systems  Pertinent positive and negative review of systems noted in HPI.    Physical Exam   Vitals:   07/26/22 1812 07/26/22 2200  BP: (!) 164/100 (!) 155/94  Pulse: 96 77  Resp: 17 16  Temp: 99 F (37.2 C) 98.8 F (37.1 C)  SpO2: 97% 100%    CONSTITUTIONAL:  non toxic-appearing, NAD NEURO:  Alert and oriented x 3, CN 3-12 grossly intact EYES:  eyes equal and reactive ENT/NECK:  Supple, no stridor  CARDIO:  normal rate, regular rhythm, intact distal pulses, some signs of venous stasi  PULM:  No respiratory distress, CTAB GI/GU:  non-distended,  MSK/SPINE:  No gross deformities, moderate RLE edema, moves all extremities  SKIN:  no rash, atraumatic   *Additional and/or pertinent findings included in MDM below  Diagnostic and Interventional Summary    EKG Interpretation  Date/Time:    Ventricular Rate:    PR Interval:    QRS Duration:   QT Interval:    QTC Calculation:   R Axis:     Text Interpretation:         Labs Reviewed  COMPREHENSIVE METABOLIC PANEL - Abnormal; Notable for the following components:      Result Value   Sodium 133 (*)    BUN <5 (*)    Total Protein 8.3 (*)    AST 52 (*)    All other components within normal limits  CBC - Abnormal; Notable for the following components:   MCH 34.3 (*)    All other components within normal limits  URINALYSIS,  ROUTINE W REFLEX MICROSCOPIC - Abnormal; Notable for the following components:   Leukocytes,Ua TRACE (*)    All other components within normal limits  LYME DISEASE SEROLOGY W/REFLEX    LE VENOUS    (Results Pending)    Medications  doxycycline (VIBRA-TABS) tablet 100 mg (has no administration in time range)     Procedures  /  Critical Care Procedures  ED Course and Medical Decision Making  I have reviewed the triage vital signs, the nursing notes, and pertinent available records from the EMR.  Social Determinants Affecting Complexity of Care: Patient has no clinically significant social determinants affecting this chief complaint..   ED Course:    Medical Decision Making Patient here with right ankle swelling.  He denies trauma or pain.  He states that he was bitten by a tick this weekend and is concerned about RMSF.  He denies any fever, rash, myalgias, or arthralgias.   He has intact pulses in the foot.  Cap refill is slightly delayed and there is edema.  I recommend that the patient have a DVT study tomorrow morning, since we can't get it now.  He is agreeable with the plan.  Will cover with doxy.  RMFS and Lyme pending.  No signs of cellulitis or abscess.  Risk Prescription drug management.     Consultants: No consultations were needed in caring for this patient.   Treatment and Plan: Emergency department workup does not suggest an emergent condition requiring admission or immediate intervention beyond  what has been performed at this time. The patient is safe for discharge and has  been instructed to return immediately for worsening symptoms, change in  symptoms or any other concerns    Final Clinical Impressions(s) / ED Diagnoses     ICD-10-CM   1. Right ankle swelling  M25.471       ED Discharge Orders          Ordered    LE VENOUS        07/26/22 2240    doxycycline (VIBRAMYCIN) 100 MG capsule  2 times daily        07/26/22 2241               Discharge Instructions Discussed with and Provided to Patient:    Discharge Instructions      I recommend that you have an ultrasound of your right calf to rule out blood clot due to the swelling of your ankle.  Since you are walking on it well, I doubt fracture.  There isn't any apparent infection.  I've ordered [facility] Spotted Fever and Lyme Disease labs, but these will not result tonight.  We will start you on treatment with Doxycycline.      Roxy Horseman, PA-C 07/26/22 2255    Jacalyn Lefevre, MD 07/27/22 [phone]

## 2022-07-27 ENCOUNTER — Ambulatory Visit (HOSPITAL_COMMUNITY)
Admission: RE | Admit: 2022-07-27 | Discharge: 2022-07-27 | Disposition: A | Discharge status: Home / Self Care | Payer: Self-pay | Source: Ambulatory Visit | Attending: Emergency Medicine | Admitting: Emergency Medicine

## 2022-07-27 DIAGNOSIS — R609 Edema, unspecified: Secondary | ICD-10-CM | POA: Insufficient documentation

## 2022-07-27 DIAGNOSIS — M7989 Other specified soft tissue disorders: Secondary | ICD-10-CM | POA: Insufficient documentation

## 2022-07-28 LAB — LYME DISEASE SEROLOGY W/REFLEX: Lyme Total Antibody EIA: NEGATIVE

## 2023-10-25 ENCOUNTER — Emergency Department (HOSPITAL_COMMUNITY)
Admission: EM | Admit: 2023-10-25 | Discharge: 2023-10-25 | Disposition: A | Discharge status: Home / Self Care | Payer: Self-pay | Attending: Emergency Medicine | Admitting: Emergency Medicine

## 2023-10-25 ENCOUNTER — Emergency Department (HOSPITAL_COMMUNITY): Payer: Self-pay

## 2023-10-25 ENCOUNTER — Other Ambulatory Visit: Payer: Self-pay

## 2023-10-25 ENCOUNTER — Encounter (HOSPITAL_COMMUNITY): Payer: Self-pay

## 2023-10-25 DIAGNOSIS — W1849XA Other slipping, tripping and stumbling without falling, initial encounter: Secondary | ICD-10-CM | POA: Insufficient documentation

## 2023-10-25 DIAGNOSIS — S82842A Displaced bimalleolar fracture of left lower leg, initial encounter for closed fracture: Secondary | ICD-10-CM | POA: Insufficient documentation

## 2023-10-25 MED ORDER — IBUPROFEN 200 MG PO TABS: 600.0000 mg | ORAL_TABLET | Freq: Four times a day (QID) | ORAL | 0 refills | Status: DC | PRN

## 2023-10-25 MED ORDER — IBUPROFEN 600 MG PO TABS: 600.0000 mg | ORAL_TABLET | Freq: Four times a day (QID) | ORAL | 0 refills | Status: AC | PRN

## 2023-10-25 MED ORDER — HYDROCODONE-ACETAMINOPHEN 5-325 MG PO TABS: 1.0000 | ORAL_TABLET | Freq: Four times a day (QID) | ORAL | 0 refills | Status: DC | PRN

## 2023-10-25 NOTE — ED Triage Notes (Signed)
 Pt presents to ED from home C/O L ankle pain s/p trip and fall. Pt endorses ETOH use today.    110/84 96% 80

## 2023-10-25 NOTE — ED Provider Triage Note (Signed)
 Emergency Medicine Provider Triage Evaluation Note  John Ritter , a 43 y.o. male  was evaluated in triage.  Pt complains of left ankle injury.  Tripped and fell this morning since then he has had pain and swelling to the left ankle unable to bear weight.  EMS applied Sam splint.  Reports alcohol use today..  Review of Systems  Positive: Ankle pain, swelling Negative: Numbness, weakness  Physical Exam  BP 127/82 (BP Location: Right Arm)   Pulse 83   Temp 97.6 F (36.4 C) (Oral)   Resp 20   Ht 5' 8 (1.727 m)   Wt 87.1 kg   SpO2 98%   BMI 29.19 kg/m  Gen:   Awake, no distress   Resp:  Normal effort  MSK:   Moves extremities without difficulty, left ankle in sam splint Other:    Medical Decision Making  Medically screening exam initiated at 9:56 AM.  Appropriate orders placed.  John Ritter was informed that the remainder of the evaluation will be completed by another provider, this initial triage assessment does not replace that evaluation, and the importance of remaining in the ED until their evaluation is complete.  X-rays ordered   John Larraine FALCON, PA-C 10/25/23 [phone]

## 2023-10-25 NOTE — Discharge Instructions (Signed)
 You have fractures in your ankle in 2 places.  You will need to remain in splint keep this clean and dry and do not put any weight on the ankle until you follow-up with orthopedics.  You should elevate the ankle is much as possible to help with swelling.  For pain you can use prescribed ibuprofen  every 6 hours and can use prescribed hydrocodone  as needed for severe breakthrough pain.  This medication can cause drowsiness, do not take prior to driving or operating machinery.  Call to schedule follow-up appointment with Dr. Jerri with orthopedics.  If you develop significantly worsened pain in your leg, discoloration of your toes, or numbness in the leg return for reevaluation.

## 2023-10-25 NOTE — Progress Notes (Signed)
 Orthopedic Tech Progress Note Patient Details:  John Ritter [birth year] [id number]  Ortho Devices Type of Ortho Device: Ace wrap, Cotton web roll, Post (short leg) splint, Stirrup splint, Crutches Ortho Device/Splint Location: left short leg posterior and stirrup splint applied. crutches sized and instructed on use Ortho Device/Splint Interventions: Ordered, Application, Adjustment   Post Interventions Patient Tolerated: Well, Difficulty with ambulation Instructions Provided: Adjustment of device, Care of device  [name] 10/25/2023, 11:58 AM

## 2023-10-26 ENCOUNTER — Encounter (HOSPITAL_BASED_OUTPATIENT_CLINIC_OR_DEPARTMENT_OTHER): Payer: Self-pay | Admitting: Physician Assistant

## 2023-10-26 ENCOUNTER — Ambulatory Visit (HOSPITAL_BASED_OUTPATIENT_CLINIC_OR_DEPARTMENT_OTHER): Payer: Self-pay | Admitting: Physician Assistant

## 2023-10-26 DIAGNOSIS — S82852A Displaced trimalleolar fracture of left lower leg, initial encounter for closed fracture: Secondary | ICD-10-CM

## 2023-10-26 NOTE — Progress Notes (Signed)
 Office Visit Note   Patient: John Ritter           Date of Birth: 03/23/1980           MRN: [id number] Visit Date: 10/26/2023              Requested by: No referring provider defined for this encounter. PCP: Patient, No Pcp Per   Assessment & Plan: Visit Diagnoses:  1. Trimalleolar fracture of ankle, closed, left, initial encounter     Plan: Patient is a pleasant 43 year old gentleman who comes in today with left ankle pain.  He fell yesterday on his left ankle he was seen and evaluated in the emergency room at [facility].  Dr. Jerri was on-call.  No previous history of fracture or injury to this ankle findings consistent with a trimalleolar fracture.  Not significantly displaced.  Patient would prefer no surgery if at all possible as he does not have insurance.  We will keep him in a well-padded splint.  I will review his x-rays have Dr. Jerri review see if he would be a candidate for nonoperative treatment.  I have also placed him on an aspirin  a day to prevent DVT.  I will call him next week.  He is fine with me leaving a message.  Also advised him to elevate above heart level  Follow-Up Instructions: No follow-ups on file.   Orders:  No orders of the defined types were placed in this encounter.  No orders of the defined types were placed in this encounter.     Procedures: No procedures performed   Clinical Data: No additional findings.   Subjective: No chief complaint on file.   HPI pleasant 43 year old gentleman who is 1 day status post falling on his left ankle.  Was seen and evaluated in the emergency room at [facility] long yesterday x-rays demonstrated a trimalleolar ankle fracture with minimal displacement and very small posterior fragment  Review of Systems  All other systems reviewed and are negative.    Objective: Vital Signs: There were no vitals taken for this visit.  Physical Exam Constitutional:      Appearance: Normal appearance.  Pulmonary:      Effort: Pulmonary effort is normal.  Skin:    General: Skin is warm and dry.  Neurological:     General: No focal deficit present.     Mental Status: He is alert and oriented to person, place, and time.  Psychiatric:        Mood and Affect: Mood normal.        Behavior: Behavior normal.     Ortho Exam Examination of his left foot and ankle he has a palpable pulse mild to moderate soft tissue swelling no ecchymosis.  Sensation is intact.  Tender more over the lateral than the medial malleolus.  Compartments are soft compressible skin is intact swelling is controlled no evidence of fracture blisters Specialty Comments:  No specialty comments available.  Imaging: No results found.   PMFS History: Patient Active Problem List   Diagnosis Date Noted   Trimalleolar fracture of ankle, closed, left, initial encounter 10/26/2023   Hip fracture (HCC) 05/16/2015   Past Medical History:  Diagnosis Date   Allergy    cat and dog   Arm fracture, left     History reviewed. No pertinent family history.  Past Surgical History:  Procedure Laterality Date   INTRAMEDULLARY (IM) NAIL INTERTROCHANTERIC Left 05/16/2015   Procedure: INTRAMEDULLARY (IM) NAIL INTERTROCHANTRIC;  Surgeon:  Donaciano Sprang, MD;  Location: [facility] OR;  Service: Orthopedics;  Laterality: Left;   Social History   Occupational History   Not on file  Tobacco Use   Smoking status: Every Day    Current packs/day: 3.00    Average packs/day: 3.0 packs/day for 16.0 years (48.0 ttl pk-yrs)    Types: Cigarettes   Smokeless tobacco: Never  Substance and Sexual Activity   Alcohol use: Yes    Alcohol/week: 111.0 standard drinks of alcohol    Types: 84 Cans of beer, 27 Shots of liquor per week   Drug use: No   Sexual activity: Yes    Partners: Female

## 2023-10-31 NOTE — ED Provider Notes (Signed)
 [place] EMERGENCY DEPARTMENT AT [facility] Provider Note   CSN: [id number] Arrival date & time: 10/25/23  9064     Patient presents with: Ankle Injury   John Ritter is a 43 y.o. male.   John Ritter is a 42 y.o. male who presents via EMS for evaluation of left ankle injury.  Patient reports he was intoxicated this morning and tripped landing on his left ankle and since then has not been able to bear weight.  Swelling and deformity noted by EMS and Sam splint applied.  No numbness or discoloration noted.  EMS noted good DP pulses.  Patient not on blood thinners.  He denies any other injury from the fall.  The history is provided by the patient and medical records.  Ankle Injury       Prior to Admission medications   Medication Sig Start Date End Date Taking? Authorizing Provider  HYDROcodone -acetaminophen  (NORCO/VICODIN) 5-325 MG tablet Take 1 tablet by mouth every 6 (six) hours as needed. 10/25/23  Yes Dede Dobesh F, PA-C  ibuprofen  (ADVIL ) 600 MG tablet Take 1 tablet (600 mg total) by mouth every 6 (six) hours as needed. 10/25/23  Yes Kenyan Karnes F, PA-C  calcium carbonate (TUMS - DOSED IN MG ELEMENTAL CALCIUM) 500 MG chewable tablet Chew 2 tablets by mouth 2 (two) times daily.    [provider]  doxycycline  (VIBRAMYCIN ) 100 MG capsule Take 1 capsule (100 mg total) by mouth 2 (two) times daily. 07/26/22   Vicky Charleston, PA-C  enoxaparin  (LOVENOX ) 40 MG/0.4ML injection Inject 0.4 mLs (40 mg total) into the skin daily. Patient not taking: Reported on 07/19/2015 05/19/15   Burnetta Aures, MD  ibuprofen  (ADVIL ) 200 MG tablet Take 3 tablets (600 mg total) by mouth every 6 (six) hours as needed. 10/25/23   [name], PA-C  methocarbamol  (ROBAXIN ) 500 MG tablet Take 1 tablet (500 mg total) by mouth 3 (three) times daily as needed for muscle spasms. Patient not taking: Reported on 07/19/2015 05/18/15   Burnetta Aures, MD  ondansetron  (ZOFRAN ) 4  MG tablet Take 1 tablet (4 mg total) by mouth every 8 (eight) hours as needed for nausea or vomiting. Patient not taking: Reported on 07/19/2015 05/18/15   Burnetta Aures, MD  oxyCODONE -acetaminophen  (PERCOCET) 10-325 MG tablet Take 1 tablet by mouth every 4 (four) hours as needed for pain. Patient not taking: Reported on 07/19/2015 05/18/15   Burnetta Aures, MD    Allergies: Patient has no known allergies.    Review of Systems  Musculoskeletal:  Positive for arthralgias and joint swelling.    Updated Vital Signs BP 127/82 (BP Location: Right Arm)   Pulse 83   Temp 97.6 F (36.4 C) (Oral)   Resp 20   Ht 5' 8 (1.727 m)   Wt 87.1 kg   SpO2 98%   BMI 29.19 kg/m   Physical Exam Vitals and nursing note reviewed.  Constitutional:      General: He is not in acute distress.    Appearance: Normal appearance. He is well-developed. He is not ill-appearing or diaphoretic.  HENT:     Head: Normocephalic and atraumatic.  Eyes:     General:        Right eye: No discharge.        Left eye: No discharge.  Pulmonary:     Effort: Pulmonary effort is normal. No respiratory distress.  Musculoskeletal:     Comments: Pain and swelling to the left medial and lateral  malleolus with mild deformity noted DP and PT pulses 2+, patient able to wiggle toes and sensation intact.  No bony tenderness at the knee  Neurological:     Mental Status: He is alert and oriented to person, place, and time.     Coordination: Coordination normal.  Psychiatric:        Mood and Affect: Mood normal.        Behavior: Behavior normal.     (all labs ordered are listed, but only abnormal results are displayed) Labs Reviewed - No data to display  EKG: None  Radiology: DG Tibia/Fibula Left Result Date: 10/25/2023 CLINICAL DATA:  fall ankle fx EXAM: LEFT TIBIA AND FIBULA - 2 VIEW COMPARISON:  None Available. FINDINGS: No acute fracture or malalignment of the proximal tibia and fibula. Redemonstrated trimalleolar  fracture of the ankle. There is no evidence of arthropathy or other focal bone abnormality. Soft tissues are unremarkable. IMPRESSION: No acute fracture or malalignment of the proximal tibia and fibula. Redemonstrated trimalleolar fracture of the ankle. Electronically Signed   By: Rogelia Myers M.D.   On: 10/25/2023 11:22   DG Ankle Complete Left Result Date: 10/25/2023 CLINICAL DATA:  Fall.  Left ankle pain. EXAM: LEFT ANKLE COMPLETE - 3+ VIEW COMPARISON:  None Available. FINDINGS: There are acute mildly displaced fractures of the medial malleolus and lateral malleolus. There is resultant disruption of ankle mortise. No other acute fracture or dislocation. No aggressive osseous lesion. There is mild asymmetric soft tissue swelling overlying the lateral malleolus. No radiopaque foreign bodies. IMPRESSION: *Acute mildly displaced fractures of the medial and lateral malleoli with resultant disruption of ankle mortise. Electronically Signed   By: Ree Molt M.D.   On: 10/25/2023 10:17      Procedures   Medications Ordered in the ED - No data to display                                  Medical Decision Making Amount and/or Complexity of Data Reviewed Radiology: ordered.  Risk OTC drugs. Prescription drug management.   Patient presents with left ankle injury, ankle is neurovascularly intact.  X-ray of the left ankle obtained and shows acute mildly displaced fractures of the medial and lateral malleoli with resultant disruption of the ankle mortise.  Discussed with Dr. Jerri with orthopedics.  Recommends short leg splint with stirrup, nonweightbearing and close outpatient orthopedic follow-up.  Also recommended getting x-ray of the full tib-fib to ensure that there is no concomitant Maisonneuve fracture.  Tib-fib x-ray showed no proximal fracture or other abnormality.   Patient placed in splint, prescribed pain control and discharged with instructions for outpatient follow-up as well as  return precautions.     Final diagnoses:  Closed bimalleolar fracture of left ankle, initial encounter    ED Discharge Orders          Ordered    HYDROcodone -acetaminophen  (NORCO/VICODIN) 5-325 MG tablet  Every 6 hours PRN        10/25/23 1200    ibuprofen  (ADVIL ) 600 MG tablet  Every 6 hours PRN        10/25/23 1200    ibuprofen  (ADVIL ) 200 MG tablet  Every 6 hours PRN        10/25/23 1200               [name], PA-C 10/31/23 2204    Mannie Pac T, DO 11/02/23 [phone]

## 2023-11-05 ENCOUNTER — Encounter: Payer: Self-pay | Admitting: Physician Assistant

## 2023-11-05 ENCOUNTER — Ambulatory Visit (INDEPENDENT_AMBULATORY_CARE_PROVIDER_SITE_OTHER): Payer: Self-pay | Admitting: Physician Assistant

## 2023-11-05 ENCOUNTER — Other Ambulatory Visit: Payer: Self-pay

## 2023-11-05 DIAGNOSIS — S82852A Displaced trimalleolar fracture of left lower leg, initial encounter for closed fracture: Secondary | ICD-10-CM

## 2023-11-05 NOTE — Progress Notes (Signed)
 Office Visit Note   Patient: John Ritter           Date of Birth: 1980-04-10           MRN: [id number] Visit Date: 11/05/2023              Requested by: No referring provider defined for this encounter. PCP: Patient, No Pcp Per  Chief Complaint  Patient presents with  . Left Ankle - Fracture      HPI:  Patient is a pleasant 42 year old gentleman who comes in today with left ankle pain.  He fell yesterday on his left ankle he was seen and evaluated in the emergency room at [facility].  Dr. Jerri was on-call.  No previous history of fracture or injury to this ankle findings consistent with a trimalleolar fracture.   He was seen in the ED and the [facility] was disrupted.  He was then seen by our NP Ronal Dragon who noted he would prefer to not have surgery if possible.  He is here today for follow up and new stress x ray.  He is in a cam boot.    Assessment & Plan: Visit Diagnoses:  1. Trimalleolar fracture of ankle, closed, left, initial encounter     Plan: He is self pay.  We placed in a non weight bearing 90 degree splint.  He has a seated walker and will continue to be NWB on the left LE.  Elevation above tha heart is encouraged.  Plan for ORIF of the left ankle Friday.    Follow-Up Instructions: Return in about 4 days (around 11/09/2023) for ORIF right ankle fracture.   Ortho Exam  Patient is alert, oriented, no adenopathy, well-dressed, normal affect, normal respiratory effort. Ecchymosis with edema in the right foot and ankle.  Tender medial and lateral ankle.  Palpable DP pulse.      Imaging: X ray 10/25/23 *Acute mildly displaced fractures of the medial and lateral malleoli with resultant disruption of ankle mortise.  New xray today 11/05/23 Stress oblique x ray shows Mortis separation of 6 mm.  Bimalleolar ankle fracture.    Labs: No results found for: HGBA1C, ESRSEDRATE, CRP, LABURIC, REPTSTATUS, GRAMSTAIN, CULT, LABORGA   Lab Results  Component  Value Date   ALBUMIN 3.9 07/26/2022   ALBUMIN 3.5 05/16/2015    No results found for: MG No results found for: VD25OH  No results found for: PREALBUMIN    Latest Ref Rng & Units 07/26/2022    6:18 PM 05/19/2015    7:39 AM 05/18/2015    4:36 AM  CBC EXTENDED  WBC 4.0 - 10.5 K/uL 9.8  7.8  7.0   RBC 4.22 - 5.81 MIL/uL 4.29  3.69  3.75   Hemoglobin 13.0 - 17.0 g/dL 85.2  87.8  87.9   HCT 39.0 - 52.0 % 42.7  35.1  35.6   Platelets 150 - 400 K/uL 261  224  187      There is no height or weight on file to calculate BMI.  Orders:  Orders Placed This Encounter  Procedures  . XR Ankle Complete Left   No orders of the defined types were placed in this encounter.    Procedures: No procedures performed  Clinical Data: No additional findings.  ROS:  All other systems negative, except as noted in the HPI. Review of Systems  Objective: Vital Signs: There were no vitals taken for this visit.  Specialty Comments:  No specialty comments available.  PMFS  History: Patient Active Problem List   Diagnosis Date Noted  . Trimalleolar fracture of ankle, closed, left, initial encounter 10/26/2023  . Hip fracture (HCC) 05/16/2015   Past Medical History:  Diagnosis Date  . Allergy    cat and dog  . Arm fracture, left     No family history on file.  Past Surgical History:  Procedure Laterality Date  . INTRAMEDULLARY (IM) NAIL INTERTROCHANTERIC Left 05/16/2015   Procedure: INTRAMEDULLARY (IM) NAIL INTERTROCHANTRIC;  Surgeon: Donaciano Sprang, MD;  Location: [facility] OR;  Service: Orthopedics;  Laterality: Left;   Social History   Occupational History  . Not on file  Tobacco Use  . Smoking status: Every Day    Current packs/day: 3.00    Average packs/day: 3.0 packs/day for 16.0 years (48.0 ttl pk-yrs)    Types: Cigarettes  . Smokeless tobacco: Never  Substance and Sexual Activity  . Alcohol use: Yes    Alcohol/week: 111.0 standard drinks of alcohol    Types: 84 Cans of  beer, 27 Shots of liquor per week  . Drug use: No  . Sexual activity: Yes    Partners: Female

## 2023-11-07 ENCOUNTER — Other Ambulatory Visit: Payer: Self-pay

## 2023-11-07 ENCOUNTER — Encounter (HOSPITAL_COMMUNITY): Payer: Self-pay | Admitting: Orthopedic Surgery

## 2023-11-07 NOTE — Progress Notes (Signed)
 SDW call  Patient was given pre-op instructions over the phone. Patient verbalized understanding of instructions provided.     PCP - denies Cardiologist -  Pulmonary:    PPM/ICD - denies Device Orders - na Rep Notified - na   Chest x-ray - na EKG -  na Stress Test - ECHO -  Cardiac Cath -   Sleep Study/sleep apnea/CPAP: denies  Non-diabetic  Blood Thinner Instructions: denies Aspirin  Instructions: per surgeon's instructions   ERAS Protcol - Clears until 0430  Anesthesia review: No   Patient denies shortness of breath, fever, cough and chest pain over the phone call  Your procedure is scheduled on Friday November 09, 2023  Report to [facility] Main Entrance A at  0530  A.M., then check in with the Admitting office.  Call this number if you have problems the morning of surgery:  [phone]   If you have any questions prior to your surgery date call [phone]: Open Monday-Friday 8am-4pm If you experience any cold or flu symptoms such as cough, fever, chills, shortness of breath, etc. between now and your scheduled surgery, please notify us  at the above number     Remember:  Do not eat after midnight the night before your surgery  You may drink clear liquids until  0430   the morning of your surgery.   Clear liquids allowed are: Water, Non-Citrus Juices (without pulp), Carbonated Beverages, Clear Tea, Black Coffee ONLY (NO MILK, CREAM OR POWDERED CREAMER of any kind), and Gatorade   Take these medicines if needed the morning of surgery with A SIP OF WATER:  Norco  As of today, STOP taking any Aspirin  (unless otherwise instructed by your surgeon) Aleve, Naproxen, Ibuprofen , Motrin , Advil , Goody's, BC's, all herbal medications, fish oil, and all vitamins.

## 2023-11-07 NOTE — H&P (Signed)
 John Ritter is an 43 y.o. male.   Chief Complaint: left ankle fracture HPI:  Patient is a pleasant 43 year old gentleman who comes in today with left ankle pain.  He fell yesterday on his left ankle he was seen and evaluated in the emergency room at [facility].  Dr. Jerri was on-call.  No previous history of fracture or injury to this ankle findings consistent with a trimalleolar fracture.              He was seen in the ED and the [facility] was disrupted.  He was then seen by our NP Ronal Dragon who noted he would prefer to not have surgery if possible.  He is here today for follow up and new stress x ray.  He is in a cam boot.  Past Medical History:  Diagnosis Date   Allergy    cat and dog   Arm fracture, left     Past Surgical History:  Procedure Laterality Date   FOREARM SURGERY Left    INTRAMEDULLARY (IM) NAIL INTERTROCHANTERIC Left 05/16/2015   Procedure: INTRAMEDULLARY (IM) NAIL INTERTROCHANTRIC;  Surgeon: Donaciano Sprang, MD;  Location: [facility] OR;  Service: Orthopedics;  Laterality: Left;    History reviewed. No pertinent family history. Social History:  reports that he has been smoking cigarettes. He has a 48 pack-year smoking history. He has never used smokeless tobacco. He reports current alcohol use of about 44.0 standard drinks of alcohol per week. He reports that he does not use drugs.  Allergies: No Known Allergies  No medications prior to admission.    No results found for this or any previous visit (from the past 48 hours). No results found.  Review of Systems  All other systems reviewed and are negative.   Height 5' 8 (1.727 m), weight 90.7 kg. Physical Exam   Patient is alert,  oriented, no adenopathy,  well-dressed,  normal affect,  normal respiratory effort, non labored breathing Ecchymosis with edema in the right foot and ankle.   Tender medial and lateral ankle.   Palpable DP pulse.   Assessment/Plan Visit Diagnoses:  1. Trimalleolar fracture of  ankle, closed, left, initial encounter       Plan: He is self pay.  We placed in a non weight bearing 90 degree splint.  He has a seated walker and will continue to be NWB on the left LE.  Elevation above tha heart is encouraged.  Plan for ORIF of the left ankle Friday.     Follow-Up Instructions: Return in about 4 days (around 11/09/2023) for ORIF right ankle fracture.    Ortho Exam   Patient is alert, oriented, no adenopathy, well-dressed, normal affect, normal respiratory effort. Ecchymosis with edema in the right foot and ankle.  Tender medial and lateral ankle.  Palpable DP pulse.         Imaging: X ray 10/25/23 *Acute mildly displaced fractures of the medial and lateral malleoli with resultant disruption of ankle mortise.   New xray today 11/05/23 Stress oblique x ray shows Mortis separation of 6 mm.  Bimalleolar ankle fracture.      Maurilio Deland Collet, PA-C 11/07/2023, 1:56 PM

## 2023-11-08 NOTE — Anesthesia Preprocedure Evaluation (Signed)
 Anesthesia Evaluation  Patient identified by MRN, date of birth, ID band Patient awake    Reviewed: Allergy & Precautions, H&P , NPO status , Patient's Chart, lab work & pertinent test results  Airway Mallampati: II  TM Distance: >3 FB Neck ROM: Full    Dental no notable dental hx. (+) Teeth Intact, Dental Advisory Given   Pulmonary neg pulmonary ROS, Current Smoker and Patient abstained from smoking.   Pulmonary exam normal breath sounds clear to auscultation       Cardiovascular Exercise Tolerance: Good negative cardio ROS  Rhythm:Regular Rate:Normal     Neuro/Psych negative neurological ROS  negative psych ROS   GI/Hepatic negative GI ROS, Neg liver ROS,,,  Endo/Other  negative endocrine ROS    Renal/GU negative Renal ROS  negative genitourinary   Musculoskeletal   Abdominal   Peds  Hematology negative hematology ROS (+)   Anesthesia Other Findings   Reproductive/Obstetrics negative OB ROS                              Anesthesia Physical Anesthesia Plan  ASA: 2  Anesthesia Plan: General   Post-op Pain Management: Regional block* and Tylenol  PO (pre-op)*   Induction: Intravenous  PONV Risk Score and Plan: 2 and Ondansetron , Dexamethasone  and Midazolam   Airway Management Planned: LMA  Additional Equipment:   Intra-op Plan:   Post-operative Plan: Extubation in OR  Informed Consent: I have reviewed the patients History and Physical, chart, labs and discussed the procedure including the risks, benefits and alternatives for the proposed anesthesia with the patient or authorized representative who has indicated his/her understanding and acceptance.     Dental advisory given  Plan Discussed with: CRNA  Anesthesia Plan Comments:          Anesthesia Quick Evaluation

## 2023-11-09 ENCOUNTER — Encounter (HOSPITAL_COMMUNITY)
Admission: RE | Disposition: A | Discharge status: Home / Self Care | Payer: Self-pay | Source: Home / Self Care | Attending: Orthopedic Surgery

## 2023-11-09 ENCOUNTER — Encounter (HOSPITAL_COMMUNITY): Payer: Self-pay | Admitting: Anesthesiology

## 2023-11-09 ENCOUNTER — Ambulatory Visit (HOSPITAL_COMMUNITY): Payer: Self-pay

## 2023-11-09 ENCOUNTER — Ambulatory Visit (HOSPITAL_BASED_OUTPATIENT_CLINIC_OR_DEPARTMENT_OTHER): Payer: Self-pay | Admitting: Anesthesiology

## 2023-11-09 ENCOUNTER — Ambulatory Visit (HOSPITAL_COMMUNITY)
Admission: RE | Admit: 2023-11-09 | Discharge: 2023-11-09 | Disposition: A | Discharge status: Home / Self Care | Payer: Self-pay | Attending: Orthopedic Surgery | Admitting: Orthopedic Surgery

## 2023-11-09 ENCOUNTER — Other Ambulatory Visit: Payer: Self-pay

## 2023-11-09 ENCOUNTER — Encounter (HOSPITAL_COMMUNITY): Payer: Self-pay | Admitting: Orthopedic Surgery

## 2023-11-09 ENCOUNTER — Other Ambulatory Visit (HOSPITAL_COMMUNITY): Payer: Self-pay

## 2023-11-09 DIAGNOSIS — S82842D Displaced bimalleolar fracture of left lower leg, subsequent encounter for closed fracture with routine healing: Secondary | ICD-10-CM

## 2023-11-09 DIAGNOSIS — S82842A Displaced bimalleolar fracture of left lower leg, initial encounter for closed fracture: Secondary | ICD-10-CM | POA: Insufficient documentation

## 2023-11-09 DIAGNOSIS — F17210 Nicotine dependence, cigarettes, uncomplicated: Secondary | ICD-10-CM | POA: Insufficient documentation

## 2023-11-09 DIAGNOSIS — S82852A Displaced trimalleolar fracture of left lower leg, initial encounter for closed fracture: Secondary | ICD-10-CM

## 2023-11-09 DIAGNOSIS — W19XXXA Unspecified fall, initial encounter: Secondary | ICD-10-CM | POA: Insufficient documentation

## 2023-11-09 HISTORY — PX: ORIF ANKLE FRACTURE: SHX5408

## 2023-11-09 LAB — CBC WITH DIFFERENTIAL/PLATELET
Abs Immature Granulocytes: 0.04 K/uL (ref 0.00–0.07)
Basophils Absolute: 0.1 K/uL (ref 0.0–0.1)
Basophils Relative: 1 %
Eosinophils Absolute: 0.2 K/uL (ref 0.0–0.5)
Eosinophils Relative: 2 %
HCT: 44.1 % (ref 39.0–52.0)
Hemoglobin: 14.9 g/dL (ref 13.0–17.0)
Immature Granulocytes: 0 %
Lymphocytes Relative: 20 %
Lymphs Abs: 2.1 K/uL (ref 0.7–4.0)
MCH: 33.6 pg (ref 26.0–34.0)
MCHC: 33.8 g/dL (ref 30.0–36.0)
MCV: 99.3 fL (ref 80.0–100.0)
Monocytes Absolute: 1.1 K/uL — ABNORMAL HIGH (ref 0.1–1.0)
Monocytes Relative: 11 %
Neutro Abs: 7 K/uL (ref 1.7–7.7)
Neutrophils Relative %: 66 %
Platelets: 324 K/uL (ref 150–400)
RBC: 4.44 MIL/uL (ref 4.22–5.81)
RDW: 13.3 % (ref 11.5–15.5)
WBC: 10.5 K/uL (ref 4.0–10.5)
nRBC: 0 % (ref 0.0–0.2)

## 2023-11-09 LAB — COMPREHENSIVE METABOLIC PANEL WITH GFR
ALT: 25 U/L (ref 0–44)
AST: 51 U/L — ABNORMAL HIGH (ref 15–41)
Albumin: 3.6 g/dL (ref 3.5–5.0)
Alkaline Phosphatase: 99 U/L (ref 38–126)
Anion gap: 14 (ref 5–15)
BUN: <5 mg/dL — ABNORMAL LOW (ref 6–20)
CO2: 20 mmol/L — ABNORMAL LOW (ref 22–32)
Calcium: 8.8 mg/dL — ABNORMAL LOW (ref 8.9–10.3)
Chloride: 101 mmol/L (ref 98–111)
Creatinine, Ser: 0.76 mg/dL (ref 0.61–1.24)
GFR, Estimated: >60 mL/min (ref >60)
Glucose, Bld: 93 mg/dL (ref 70–99)
Potassium: 4.7 mmol/L (ref 3.5–5.1)
Sodium: 135 mmol/L (ref 135–145)
Total Bilirubin: 1.6 mg/dL — ABNORMAL HIGH (ref 0.0–1.2)
Total Protein: 7.3 g/dL (ref 6.5–8.1)

## 2023-11-09 LAB — SURGICAL PCR SCREEN
MRSA, PCR: NEGATIVE
Staphylococcus aureus: NEGATIVE

## 2023-11-09 SURGERY — OPEN REDUCTION INTERNAL FIXATION (ORIF) ANKLE FRACTURE
Anesthesia: General | Site: Ankle | Laterality: Left

## 2023-11-09 MED ORDER — MIDAZOLAM HCL 2 MG/2ML IJ SOLN
INTRAMUSCULAR | Status: AC
Start: 2023-11-09 — End: 2023-11-09
  Filled 2023-11-09: qty 2

## 2023-11-09 MED ORDER — EPHEDRINE 5 MG/ML INJ
INTRAVENOUS | Status: AC
Start: 2023-11-09 — End: 2023-11-09
  Filled 2023-11-09: qty 5

## 2023-11-09 MED ORDER — VASHE WOUND IRRIGATION OPTIME
TOPICAL | Status: DC | PRN
  Administered 2023-11-09: 34 [oz_av]

## 2023-11-09 MED ORDER — DEXAMETHASONE SODIUM PHOSPHATE 10 MG/ML IJ SOLN
INTRAMUSCULAR | Status: DC | PRN
  Administered 2023-11-09: 5 mg via INTRAVENOUS

## 2023-11-09 MED ORDER — ONDANSETRON HCL 4 MG/2ML IJ SOLN
INTRAMUSCULAR | Status: DC | PRN
Start: 2023-11-09 — End: 2023-11-09
  Administered 2023-11-09: 4 mg via INTRAVENOUS

## 2023-11-09 MED ORDER — FENTANYL CITRATE (PF) 250 MCG/5ML IJ SOLN
INTRAMUSCULAR | Status: AC
  Filled 2023-11-09: qty 5

## 2023-11-09 MED ORDER — CHLORHEXIDINE GLUCONATE 0.12 % MT SOLN
15.0000 mL | Freq: Once | OROMUCOSAL | Status: AC
  Administered 2023-11-09: 15 mL via OROMUCOSAL
  Filled 2023-11-09: qty 15

## 2023-11-09 MED ORDER — OXYCODONE-ACETAMINOPHEN 5-325 MG PO TABS
1.0000 | ORAL_TABLET | Freq: Four times a day (QID) | ORAL | 0 refills | Status: AC | PRN
  Filled 2023-11-09: qty 20, 5d supply, fill #0

## 2023-11-09 MED ORDER — PROPOFOL 10 MG/ML IV BOLUS
INTRAVENOUS | Status: DC | PRN
Start: 2023-11-09 — End: 2023-11-09
  Administered 2023-11-09 (×2): 200 mg via INTRAVENOUS

## 2023-11-09 MED ORDER — BUPIVACAINE-EPINEPHRINE (PF) 0.5% -1:200000 IJ SOLN
INTRAMUSCULAR | Status: DC | PRN
  Administered 2023-11-09: 15 mL via PERINEURAL
  Administered 2023-11-09: 20 mL via PERINEURAL

## 2023-11-09 MED ORDER — HYDROMORPHONE HCL 1 MG/ML IJ SOLN: 0.2500 mg | INTRAMUSCULAR | Status: DC | PRN

## 2023-11-09 MED ORDER — BUPIVACAINE LIPOSOME 1.3 % IJ SUSP
INTRAMUSCULAR | Status: AC
  Filled 2023-11-09: qty 10

## 2023-11-09 MED ORDER — MIDAZOLAM HCL 2 MG/2ML IJ SOLN
INTRAMUSCULAR | Status: DC | PRN
  Administered 2023-11-09: 2 mg via INTRAVENOUS

## 2023-11-09 MED ORDER — LACTATED RINGERS IV SOLN: INTRAVENOUS | Status: DC

## 2023-11-09 MED ORDER — CEFAZOLIN SODIUM-DEXTROSE 2-4 GM/100ML-% IV SOLN
2.0000 g | INTRAVENOUS | Status: AC
  Administered 2023-11-09: 2 g via INTRAVENOUS
  Filled 2023-11-09: qty 100

## 2023-11-09 MED ORDER — 0.9 % SODIUM CHLORIDE (POUR BTL) OPTIME
TOPICAL | Status: DC | PRN
  Administered 2023-11-09: 1000 mL

## 2023-11-09 MED ORDER — EPHEDRINE SULFATE-NACL 50-0.9 MG/10ML-% IV SOSY
PREFILLED_SYRINGE | INTRAVENOUS | Status: DC | PRN
Start: 2023-11-09 — End: 2023-11-09
  Administered 2023-11-09: 5 mg via INTRAVENOUS
  Administered 2023-11-09: 10 mg via INTRAVENOUS

## 2023-11-09 MED ORDER — ACETAMINOPHEN 500 MG PO TABS
1000.0000 mg | ORAL_TABLET | Freq: Once | ORAL | Status: AC
  Administered 2023-11-09: 1000 mg via ORAL
  Filled 2023-11-09: qty 2

## 2023-11-09 MED ORDER — DEXAMETHASONE SODIUM PHOSPHATE 10 MG/ML IJ SOLN
INTRAMUSCULAR | Status: AC
  Filled 2023-11-09: qty 1

## 2023-11-09 MED ORDER — PROPOFOL 10 MG/ML IV BOLUS
INTRAVENOUS | Status: AC
  Filled 2023-11-09: qty 20

## 2023-11-09 MED ORDER — LIDOCAINE 2% (20 MG/ML) 5 ML SYRINGE
INTRAMUSCULAR | Status: DC | PRN
  Administered 2023-11-09: 60 mg via INTRAVENOUS

## 2023-11-09 MED ORDER — FENTANYL CITRATE (PF) 250 MCG/5ML IJ SOLN
INTRAMUSCULAR | Status: DC | PRN
  Administered 2023-11-09 (×3): 50 ug via INTRAVENOUS

## 2023-11-09 MED ORDER — ONDANSETRON HCL 4 MG/2ML IJ SOLN
INTRAMUSCULAR | Status: AC
  Filled 2023-11-09: qty 2

## 2023-11-09 MED ORDER — LIDOCAINE 2% (20 MG/ML) 5 ML SYRINGE
INTRAMUSCULAR | Status: AC
  Filled 2023-11-09: qty 5

## 2023-11-09 MED ORDER — ORAL CARE MOUTH RINSE: 15.0000 mL | Freq: Once | OROMUCOSAL | Status: AC

## 2023-11-09 MED ORDER — BUPIVACAINE LIPOSOME 1.3 % IJ SUSP
INTRAMUSCULAR | Status: DC | PRN
  Administered 2023-11-09: 10 mL via PERINEURAL

## 2023-11-09 SURGICAL SUPPLY — 38 items
BAG COUNTER SPONGE SURGICOUNT (BAG) ×1 IMPLANT
BANDAGE ESMARK 6X9 LF (GAUZE/BANDAGES/DRESSINGS) IMPLANT
BIT DRILL 110X2.5XQCK CNCT (BIT) IMPLANT
BIT DRILL 2.7XCANN QCK CNCT (BIT) IMPLANT
BLADE SURG 21 STRL SS (BLADE) IMPLANT
BNDG COHESIVE 4X5 TAN STRL LF (GAUZE/BANDAGES/DRESSINGS) ×1 IMPLANT
BNDG GAUZE DERMACEA FLUFF 4 (GAUZE/BANDAGES/DRESSINGS) ×1 IMPLANT
CLEANSER WND VASHE 34 (WOUND CARE) IMPLANT
COVER SURGICAL LIGHT HANDLE (MISCELLANEOUS) ×1 IMPLANT
DRAPE OEC MINIVIEW 54X84 (DRAPES) IMPLANT
DRAPE U-SHAPE 47X51 STRL (DRAPES) ×1 IMPLANT
DRSG ADAPTIC 3X8 NADH LF (GAUZE/BANDAGES/DRESSINGS) ×1 IMPLANT
DURAPREP 26ML APPLICATOR (WOUND CARE) ×1 IMPLANT
ELECTRODE REM PT RTRN 9FT ADLT (ELECTROSURGICAL) ×1 IMPLANT
GAUZE PAD ABD 8X10 STRL (GAUZE/BANDAGES/DRESSINGS) ×1 IMPLANT
GAUZE SPONGE 4X4 12PLY STRL (GAUZE/BANDAGES/DRESSINGS) ×1 IMPLANT
GLOVE BIOGEL PI IND STRL 9 (GLOVE) ×1 IMPLANT
GLOVE SURG ORTHO 9.0 STRL STRW (GLOVE) ×1 IMPLANT
GOWN STRL REUS W/ TWL XL LVL3 (GOWN DISPOSABLE) ×3 IMPLANT
KIT BASIN OR (CUSTOM PROCEDURE TRAY) ×1 IMPLANT
KIT TURNOVER KIT B (KITS) ×1 IMPLANT
KWIRE 1.25 PACK OF 10 (WIRE) IMPLANT
MANIFOLD NEPTUNE II (INSTRUMENTS) ×1 IMPLANT
NS IRRIG 1000ML POUR BTL (IV SOLUTION) ×1 IMPLANT
PACK ORTHO EXTREMITY (CUSTOM PROCEDURE TRAY) ×1 IMPLANT
PAD ARMBOARD POSITIONER FOAM (MISCELLANEOUS) ×2 IMPLANT
PENCIL BUTTON HOLSTER BLD 10FT (ELECTRODE) IMPLANT
PLATE 7HOLE 1/3 TUBULAR (Plate) IMPLANT
SCREW CANN 1/2THD 40X4.0 (Screw) IMPLANT
SCREW CORTICAL 3.5 16MM (Screw) IMPLANT
SCREW CORTICAL 3.5X12 (Screw) IMPLANT
STAPLER SKIN PROX 35W (STAPLE) IMPLANT
SUCTION TUBE FRAZIER 10FR DISP (SUCTIONS) ×1 IMPLANT
SUT ETHILON 2 0 PSLX (SUTURE) IMPLANT
SUT VIC AB 2-0 CT1 TAPERPNT 27 (SUTURE) ×1 IMPLANT
TOWEL GREEN STERILE (TOWEL DISPOSABLE) ×1 IMPLANT
TOWEL GREEN STERILE FF (TOWEL DISPOSABLE) ×1 IMPLANT
TUBE CONNECTING 12X1/4 (SUCTIONS) ×1 IMPLANT

## 2023-11-09 NOTE — Anesthesia Procedure Notes (Signed)
 Procedure Name: LMA Insertion Date/Time: 11/09/2023 7:44 AM  Performed by: Hedy Jarred, CRNAPre-anesthesia Checklist: Patient identified, Emergency Drugs available, Suction available and Patient being monitored Patient Re-evaluated:Patient Re-evaluated prior to induction Oxygen Delivery Method: Circle System Utilized Preoxygenation: Pre-oxygenation with 100% oxygen Induction Type: IV induction Ventilation: Mask ventilation without difficulty LMA: LMA inserted LMA Size: 4.0 Number of attempts: 1 Airway Equipment and Method: Bite block Placement Confirmation: positive ETCO2 Tube secured with: Tape Dental Injury: Teeth and Oropharynx as per pre-operative assessment

## 2023-11-09 NOTE — Anesthesia Procedure Notes (Addendum)
 Anesthesia Regional Block: Popliteal block   Pre-Anesthetic Checklist: , timeout performed,  Correct Patient, Correct Site, Correct Laterality,  Correct Procedure, Correct Position, site marked,  Risks and benefits discussed,  Pre-op evaluation,  At surgeon's request and post-op pain management  Laterality: Left  Prep: Maximum Sterile Barrier Precautions used, chloraprep       Needles:  Injection technique: Single-shot  Needle Type: Echogenic Stimulator Needle     Needle Length: 9cm  Needle Gauge: 21     Additional Needles:   Procedures:,,,, ultrasound used (permanent image in chart),,    Narrative:  Start time: 11/09/2023 6:55 AM End time: 11/09/2023 7:05 AM Injection made incrementally with aspirations every 5 mL.  Performed by: Personally  Anesthesiologist: Epifanio Fallow, MD

## 2023-11-09 NOTE — Anesthesia Postprocedure Evaluation (Signed)
 Anesthesia Post Note  Patient: John Ritter  Procedure(s) Performed: OPEN REDUCTION INTERNAL FIXATION (ORIF) ANKLE FRACTURE (Left: Ankle)     Patient location during evaluation: PACU Anesthesia Type: General and Regional Level of consciousness: awake and alert Pain management: pain level controlled Vital Signs Assessment: post-procedure vital signs reviewed and stable Respiratory status: spontaneous breathing, nonlabored ventilation and respiratory function stable Cardiovascular status: blood pressure returned to baseline and stable Postop Assessment: no apparent nausea or vomiting Anesthetic complications: no   No notable events documented.  Last Vitals:  Vitals:   11/09/23 0857 11/09/23 0900  BP:  (!) 153/80  Pulse:  85  Resp:  16  Temp: (!) 36.3 C   SpO2:  95%    Last Pain:  Vitals:   11/09/23 0900  TempSrc:   PainSc: 0-No pain                 Amarachukwu Lakatos,W. EDMOND

## 2023-11-09 NOTE — Transfer of Care (Signed)
 Immediate Anesthesia Transfer of Care Note  Patient: John Ritter  Procedure(s) Performed: OPEN REDUCTION INTERNAL FIXATION (ORIF) ANKLE FRACTURE (Left: Ankle)  Patient Location: PACU  Anesthesia Type:General  Level of Consciousness: awake, alert , and oriented  Airway & Oxygen Therapy: Patient Spontanous Breathing  Post-op Assessment: Report given to RN and Post -op Vital signs reviewed and stable  Post vital signs: Reviewed and stable  Last Vitals:  Vitals Value Taken Time  BP 148/83 11/09/23 08:35  Temp 36.3 C 11/09/23 08:35  Pulse 82 11/09/23 08:45  Resp 14 11/09/23 08:45  SpO2 96 % 11/09/23 08:45  Vitals shown include unfiled device data.  Last Pain:  Vitals:   11/09/23 0835  TempSrc:   PainSc: 0-No pain      Patients Stated Pain Goal: 0 (11/09/23 0600)  Complications: No notable events documented.

## 2023-11-09 NOTE — Discharge Instructions (Signed)
 No weight on the surgical lower extremity.  Use crutches for ambulation.  Lay down to elevate you foot above your heart to decrease swelling.  Swelling causes pain.  F/U 1 week.  Keep dressing clean and dry.  Do not remove cam boot.

## 2023-11-09 NOTE — Op Note (Signed)
 11/09/2023  8:23 AM  PATIENT:  John Ritter    PRE-OPERATIVE DIAGNOSIS:  Left Bimalleolar Ankle Fracture  POST-OPERATIVE DIAGNOSIS:  Same  PROCEDURE:  OPEN REDUCTION INTERNAL FIXATION (ORIF) ANKLE FRACTURE C arm fluoroscopy to verify reduction.  SURGEON:  Jerona LULLA Sage, MD  PHYSICIAN ASSISTANT:None ANESTHESIA:   General  PREOPERATIVE INDICATIONS:  [name] is a  43 y.o. male with a diagnosis of Left Bimalleolar Ankle Fracture who failed conservative measures and elected for surgical management.    The risks benefits and alternatives were discussed with the patient preoperatively including but not limited to the risks of infection, bleeding, nerve injury, cardiopulmonary complications, the need for revision surgery, among others, and the patient was willing to proceed.  OPERATIVE IMPLANTS:   Implant Name Type Inv. Item Serial No. Manufacturer Lot No. LRB No. Used Action  PLATE TUBULAR 9 HOLE 1/3 - [id number] Plate PLATE TUBULAR 9 HOLE 1/3  [employer](ORTH,TRAU,BIO,SG)  Left 1 Implanted  SCREW CORTICAL 3.5X12 - [id number] Screw SCREW CORTICAL 3.5X12  [employer](ORTH,TRAU,BIO,SG)  Left 2 Implanted  SCREW CORTICAL 3.5 - [id number] Screw SCREW CORTICAL 3.5  [employer]ON(ORTH,TRAU,BIO,SG)  Left 2 Implanted  SCREW CANN 1/2THD 40X4.0 - [id number] Screw SCREW CANN 1/2THD 40X4.0  [employer]ON(ORTH,TRAU,BIO,SG)  Left 1 Implanted    @ENCIMAGES @  OPERATIVE FINDINGS: C-arm Shrosbree verified reduction of the medial and lateral malleolus with the lateral malleolus pulled out to length.  OPERATIVE PROCEDURE: Patient was brought the operating room after undergoing a regional block the underwent a general anesthetic.  After adequate level anesthesia obtained patient's left lower extremity was first scrubbed with Hibiclens  dried and then prepped using DuraPrep draped into a sterile field a timeout was called.  Lateral incision was made over the fibula this was carried  down to subperiosteal dissection the fracture was cleansed irrigated and the fibula pulled out to length.  A fibular plate was placed laterally with 2 compression screws placed distally.  Then with the screws placed eccentrically in the proximal holes the fibula was pushed out to length with 2 compression screws.  C-arm Shrosbree verified the fibula out to length.  The wound was irrigated with Vashe incision closed using 2-0 nylon.  Incision was then placed over the medial malleolus the fracture was freshened identified irrigated with Vashe and reduced.  A guidewire was placed and this was stabilized with a 40 mm partially-threaded cannulated screw.  There was sufficient bone for 1 compression screw.  C-arm Foscavir verified reduction.  The wound was irrigated with Vashe incision closed using 2-0 nylon a sterile dressing was applied.  Patient was extubated taken the PACU in stable condition.   DISCHARGE PLANNING:  Antibiotic duration: Preoperative antibiotics  Weightbearing: Nonweightbearing on the left  Pain medication: Prescription for Percocet  Dressing care/ Wound VAC: Dry dressing  Ambulatory devices: Crutches and walker  Discharge to: Home.  Follow-up: In the office 1 week post operative.

## 2023-11-09 NOTE — Interval H&P Note (Signed)
 History and Physical Interval Note:  11/09/2023 6:37 AM  John Ritter  has presented today for surgery, with the diagnosis of Left Bimalleolar Ankle Fracture.  The various methods of treatment have been discussed with the patient and family. After consideration of risks, benefits and other options for treatment, the patient has consented to  Procedure(s): OPEN REDUCTION INTERNAL FIXATION (ORIF) ANKLE FRACTURE (Left) as a surgical intervention.  The patient's history has been reviewed, patient examined, no change in status, stable for surgery.  I have reviewed the patient's chart and labs.  Questions were answered to the patient's satisfaction.     Kalany Diekmann V Clarisse Rodriges

## 2023-11-09 NOTE — Progress Notes (Signed)
 Orthopedic Tech Progress Note Patient Details:  CAMRON MONDAY 1980-06-22 [id number]  Ortho Devices Type of Ortho Device: CAM walker Ortho Device/Splint Interventions: Ordered, Adjustment, Application   Post Interventions Patient Tolerated: Well  Liliahna Cudd A Quantina Dershem 11/09/2023, 8:59 AM

## 2023-11-09 NOTE — Anesthesia Procedure Notes (Addendum)
 Anesthesia Regional Block: Adductor canal block   Pre-Anesthetic Checklist: , timeout performed,  Correct Patient, Correct Site, Correct Laterality,  Correct Procedure, Correct Position, site marked,  Risks and benefits discussed,  Pre-op evaluation,  At surgeon's request and post-op pain management  Laterality: Left  Prep: Maximum Sterile Barrier Precautions used, chloraprep       Needles:  Injection technique: Single-shot  Needle Type: Echogenic Stimulator Needle     Needle Length: 9cm  Needle Gauge: 21     Additional Needles:   Procedures:,,,, ultrasound used (permanent image in chart),,    Narrative:  Start time: 11/09/2023 7:05 AM End time: 11/09/2023 7:10 AM Injection made incrementally with aspirations every 5 mL.  Performed by: Personally  Anesthesiologist: Epifanio Fallow, MD

## 2023-11-12 ENCOUNTER — Encounter (HOSPITAL_COMMUNITY): Payer: Self-pay | Admitting: Orthopedic Surgery

## 2023-11-16 ENCOUNTER — Other Ambulatory Visit: Payer: Self-pay

## 2023-11-16 ENCOUNTER — Encounter: Payer: Self-pay | Admitting: Family

## 2023-11-16 ENCOUNTER — Ambulatory Visit (INDEPENDENT_AMBULATORY_CARE_PROVIDER_SITE_OTHER): Payer: Self-pay | Admitting: Family

## 2023-11-16 DIAGNOSIS — Z9889 Other specified postprocedural states: Secondary | ICD-10-CM

## 2023-11-16 DIAGNOSIS — S82852A Displaced trimalleolar fracture of left lower leg, initial encounter for closed fracture: Secondary | ICD-10-CM

## 2023-11-16 DIAGNOSIS — Z8781 Personal history of (healed) traumatic fracture: Secondary | ICD-10-CM

## 2023-11-16 NOTE — Patient Instructions (Signed)
 Cleanse ankle incisions once daily with soap and water. Pat dry. Apply gauze and ace wrap. Remain nonweight bearing on the left foot until follow up.

## 2023-11-16 NOTE — Progress Notes (Signed)
   Post-Op Visit Note   Patient: John Ritter           Date of Birth: 12-Sep-1980           MRN: [id number] Visit Date: 11/16/2023 PCP: Pcp, No  Chief Complaint:  Chief Complaint  Patient presents with   Left Ankle - Routine Post Op    11/09/2023 left ankle ORIF     HPI:  HPI The patient is a 43 year old gentleman who is seen status post ORIF left ankle for a trimalleolar fracture Ortho Exam On examination left ankle the medial and lateral incisions are well-approximated sutures there is no erythema no drainage.  Palpable dorsalis pedis pulse.  Visit Diagnoses: No diagnosis found.  Plan: Begin daily Dial soap cleansing.  Dry dressings.  Continue cam boot.  Continue nonweightbearing.  Follow-Up Instructions: No follow-ups on file.   Imaging: No results found.  Orders:  No orders of the defined types were placed in this encounter.  No orders of the defined types were placed in this encounter.    PMFS History: Patient Active Problem List   Diagnosis Date Noted   Trimalleolar fracture of ankle, closed, left, initial encounter 10/26/2023   Hip fracture (HCC) 05/16/2015   Past Medical History:  Diagnosis Date   Allergy    cat and dog   Arm fracture, left     No family history on file.  Past Surgical History:  Procedure Laterality Date   FOREARM SURGERY Left    INTRAMEDULLARY (IM) NAIL INTERTROCHANTERIC Left 05/16/2015   Procedure: INTRAMEDULLARY (IM) NAIL INTERTROCHANTRIC;  Surgeon: Donaciano Sprang, MD;  Location: [facility] OR;  Service: Orthopedics;  Laterality: Left;   ORIF ANKLE FRACTURE Left 11/09/2023   Procedure: OPEN REDUCTION INTERNAL FIXATION (ORIF) ANKLE FRACTURE;  Surgeon: Harden Jerona GAILS, MD;  Location: [facility] OR;  Service: Orthopedics;  Laterality: Left;   Social History   Occupational History   Not on file  Tobacco Use   Smoking status: Every Day    Current packs/day: 3.00    Average packs/day: 3.0 packs/day for 16.0 years (48.0 ttl pk-yrs)    Types:  Cigarettes   Smokeless tobacco: Never  Vaping Use   Vaping status: Never Used  Substance and Sexual Activity   Alcohol use: Yes    Alcohol/week: 44.0 standard drinks of alcohol    Types: 36 Cans of beer, 8 Shots of liquor per week   Drug use: No   Sexual activity: Yes    Partners: Female

## 2023-11-27 ENCOUNTER — Encounter: Payer: Self-pay | Admitting: Family

## 2023-11-27 ENCOUNTER — Other Ambulatory Visit: Payer: Self-pay

## 2023-11-27 ENCOUNTER — Ambulatory Visit (INDEPENDENT_AMBULATORY_CARE_PROVIDER_SITE_OTHER): Payer: Self-pay | Admitting: Family

## 2023-11-27 DIAGNOSIS — S82852A Displaced trimalleolar fracture of left lower leg, initial encounter for closed fracture: Secondary | ICD-10-CM

## 2023-11-27 NOTE — Progress Notes (Signed)
   Post-Op Visit Note   Patient: John Ritter           Date of Birth: November 07, 1980           MRN: [id number] Visit Date: 11/27/2023 PCP: Pcp, No  Chief Complaint:  Chief Complaint  Patient presents with   Left Ankle - Routine Post Op    11/09/2023 left ankle ORIF    HPI:  HPI The patient is a 43 year old gentleman who is seen status post ORIF left ankle for a trimalleolar fracture Ortho Exam On examination left ankle the medial and lateral incisions are well-approximated.  Sutures harvested from medial incision.  there is no erythema no drainage.  Palpable dorsalis pedis pulse.  Visit Diagnoses: No diagnosis found.  Plan: continue daily Dial soap cleansing.  Dry dressings.  Continue cam boot.  Continue nonweightbearing.  Radiographs of the left ankle at follow-up  Follow-Up Instructions: No follow-ups on file.   Imaging: No results found.  Orders:  No orders of the defined types were placed in this encounter.  No orders of the defined types were placed in this encounter.    PMFS History: Patient Active Problem List   Diagnosis Date Noted   Trimalleolar fracture of ankle, closed, left, initial encounter 10/26/2023   Hip fracture (HCC) 05/16/2015   Past Medical History:  Diagnosis Date   Allergy    cat and dog   Arm fracture, left     No family history on file.  Past Surgical History:  Procedure Laterality Date   FOREARM SURGERY Left    INTRAMEDULLARY (IM) NAIL INTERTROCHANTERIC Left 05/16/2015   Procedure: INTRAMEDULLARY (IM) NAIL INTERTROCHANTRIC;  Surgeon: Donaciano Sprang, MD;  Location: [facility] OR;  Service: Orthopedics;  Laterality: Left;   ORIF ANKLE FRACTURE Left 11/09/2023   Procedure: OPEN REDUCTION INTERNAL FIXATION (ORIF) ANKLE FRACTURE;  Surgeon: Harden Jerona GAILS, MD;  Location: [facility] OR;  Service: Orthopedics;  Laterality: Left;   Social History   Occupational History   Not on file  Tobacco Use   Smoking status: Every Day    Current packs/day: 3.00     Average packs/day: 3.0 packs/day for 16.0 years (48.0 ttl pk-yrs)    Types: Cigarettes   Smokeless tobacco: Never  Vaping Use   Vaping status: Never Used  Substance and Sexual Activity   Alcohol use: Yes    Alcohol/week: 44.0 standard drinks of alcohol    Types: 36 Cans of beer, 8 Shots of liquor per week   Drug use: No   Sexual activity: Yes    Partners: Female

## 2023-12-04 ENCOUNTER — Ambulatory Visit (INDEPENDENT_AMBULATORY_CARE_PROVIDER_SITE_OTHER): Payer: Self-pay | Admitting: Family

## 2023-12-04 ENCOUNTER — Other Ambulatory Visit: Payer: Self-pay

## 2023-12-04 DIAGNOSIS — Z8781 Personal history of (healed) traumatic fracture: Secondary | ICD-10-CM

## 2023-12-04 DIAGNOSIS — Z9889 Other specified postprocedural states: Secondary | ICD-10-CM

## 2023-12-04 NOTE — Progress Notes (Signed)
   Post-Op Visit Note   Patient: John Ritter           Date of Birth: 05/27/1980           MRN: [id number] Visit Date: 12/04/2023 PCP: Pcp, No  Chief Complaint:  Chief Complaint  Patient presents with   Left Ankle - Routine Post Op    11/09/2023 left ankle ORIF    HPI:  HPI The patient is a 43 year old gentleman seen status post ORIF left ankle fracture has been in the cam boot nonweightbearing with a rollator. Ortho Exam On examination left ankle the medial incision is well-healed the lateral incision is well-healed as well sutures present laterally these are harvested today without incident there is no surrounding erythema or drainage  Visit Diagnoses:  1. S/P ORIF (open reduction internal fixation) fracture     Plan: Continue with cam boot he will continue nonweightbearing until follow-up May shower may get this wet may discontinue the Ace wrap's  Follow-Up Instructions: No follow-ups on file.   Imaging: No results found.  Orders:  Orders Placed This Encounter  Procedures   XR Ankle Complete Left   No orders of the defined types were placed in this encounter.    PMFS History: Patient Active Problem List   Diagnosis Date Noted   Trimalleolar fracture of ankle, closed, left, initial encounter 10/26/2023   Hip fracture (HCC) 05/16/2015   Past Medical History:  Diagnosis Date   Allergy    cat and dog   Arm fracture, left     No family history on file.  Past Surgical History:  Procedure Laterality Date   FOREARM SURGERY Left    INTRAMEDULLARY (IM) NAIL INTERTROCHANTERIC Left 05/16/2015   Procedure: INTRAMEDULLARY (IM) NAIL INTERTROCHANTRIC;  Surgeon: Donaciano Sprang, MD;  Location: [facility] OR;  Service: Orthopedics;  Laterality: Left;   ORIF ANKLE FRACTURE Left 11/09/2023   Procedure: OPEN REDUCTION INTERNAL FIXATION (ORIF) ANKLE FRACTURE;  Surgeon: Harden Jerona GAILS, MD;  Location: [facility] OR;  Service: Orthopedics;  Laterality: Left;   Social History    Occupational History   Not on file  Tobacco Use   Smoking status: Every Day    Current packs/day: 3.00    Average packs/day: 3.0 packs/day for 16.0 years (48.0 ttl pk-yrs)    Types: Cigarettes   Smokeless tobacco: Never  Vaping Use   Vaping status: Never Used  Substance and Sexual Activity   Alcohol use: Yes    Alcohol/week: 44.0 standard drinks of alcohol    Types: 36 Cans of beer, 8 Shots of liquor per week   Drug use: No   Sexual activity: Yes    Partners: Female

## 2023-12-09 ENCOUNTER — Encounter: Payer: Self-pay | Admitting: Family

## 2023-12-18 ENCOUNTER — Encounter: Payer: Self-pay | Admitting: Family

## 2023-12-18 ENCOUNTER — Ambulatory Visit: Payer: Self-pay | Admitting: Family

## 2023-12-18 DIAGNOSIS — S82852A Displaced trimalleolar fracture of left lower leg, initial encounter for closed fracture: Secondary | ICD-10-CM

## 2023-12-18 NOTE — Progress Notes (Signed)
   Post-Op Visit Note   Patient: John Ritter           Date of Birth: August 03, 1980           MRN: [id number] Visit Date: 12/18/2023 PCP: Pcp, No  Chief Complaint:  Chief Complaint  Patient presents with   Left Ankle - Routine Post Op    11/09/2023 left ankle ORIF    HPI:  HPI The patient is a 43 year old gentleman who is seen status post left ankle open reduction internal fixation September 12 is doing well he is full weightbearing in the cam boot Ortho Exam On examination left ankle his incisions are well-healed No edema no erythema  Visit Diagnoses: No diagnosis found.  Plan: Begin full weightbearing May advance to regular shoewear will follow-up in the office as needed  Follow-Up Instructions: No follow-ups on file.   Imaging: No results found.  Orders:  No orders of the defined types were placed in this encounter.  No orders of the defined types were placed in this encounter.    PMFS History: Patient Active Problem List   Diagnosis Date Noted   Trimalleolar fracture of ankle, closed, left, initial encounter 10/26/2023   Hip fracture (HCC) 05/16/2015   Past Medical History:  Diagnosis Date   Allergy    cat and dog   Arm fracture, left     No family history on file.  Past Surgical History:  Procedure Laterality Date   FOREARM SURGERY Left    INTRAMEDULLARY (IM) NAIL INTERTROCHANTERIC Left 05/16/2015   Procedure: INTRAMEDULLARY (IM) NAIL INTERTROCHANTRIC;  Surgeon: Donaciano Sprang, MD;  Location: [facility] OR;  Service: Orthopedics;  Laterality: Left;   ORIF ANKLE FRACTURE Left 11/09/2023   Procedure: OPEN REDUCTION INTERNAL FIXATION (ORIF) ANKLE FRACTURE;  Surgeon: Harden Jerona GAILS, MD;  Location: [facility] OR;  Service: Orthopedics;  Laterality: Left;   Social History   Occupational History   Not on file  Tobacco Use   Smoking status: Every Day    Current packs/day: 3.00    Average packs/day: 3.0 packs/day for 16.0 years (48.0 ttl pk-yrs)    Types: Cigarettes    Smokeless tobacco: Never  Vaping Use   Vaping status: Never Used  Substance and Sexual Activity   Alcohol use: Yes    Alcohol/week: 44.0 standard drinks of alcohol    Types: 36 Cans of beer, 8 Shots of liquor per week   Drug use: No   Sexual activity: Yes    Partners: Female

## 2023-12-31 ENCOUNTER — Encounter: Payer: Self-pay | Admitting: Radiology
